# Patient Record
Sex: Female | Born: 1970 | Race: White | Marital: Married | Smoking: Never smoker
Health system: Southern US, Community
[De-identification: ages and names within clinical notes are randomized; demographics above are authoritative.]

## PROBLEM LIST (undated history)

## (undated) DIAGNOSIS — F064 Anxiety disorder due to known physiological condition: Secondary | ICD-10-CM

## (undated) DIAGNOSIS — L03317 Cellulitis of buttock: Secondary | ICD-10-CM

## (undated) DIAGNOSIS — K760 Fatty (change of) liver, not elsewhere classified: Secondary | ICD-10-CM

## (undated) DIAGNOSIS — I4892 Unspecified atrial flutter: Secondary | ICD-10-CM

## (undated) DIAGNOSIS — F32A Depression, unspecified: Secondary | ICD-10-CM

## (undated) DIAGNOSIS — R000 Tachycardia, unspecified: Secondary | ICD-10-CM

## (undated) DIAGNOSIS — L0231 Cutaneous abscess of buttock: Secondary | ICD-10-CM

## (undated) DIAGNOSIS — E876 Hypokalemia: Secondary | ICD-10-CM

## (undated) DIAGNOSIS — B372 Candidiasis of skin and nail: Secondary | ICD-10-CM

## (undated) DIAGNOSIS — E039 Hypothyroidism, unspecified: Secondary | ICD-10-CM

## (undated) DIAGNOSIS — E1165 Type 2 diabetes mellitus with hyperglycemia: Secondary | ICD-10-CM

## (undated) DIAGNOSIS — B373 Candidiasis of vulva and vagina: Secondary | ICD-10-CM

## (undated) DIAGNOSIS — A419 Sepsis, unspecified organism: Secondary | ICD-10-CM

## (undated) DIAGNOSIS — E785 Hyperlipidemia, unspecified: Secondary | ICD-10-CM

## (undated) DIAGNOSIS — Z8709 Personal history of other diseases of the respiratory system: Secondary | ICD-10-CM

## (undated) DIAGNOSIS — I10 Essential (primary) hypertension: Secondary | ICD-10-CM

## (undated) DIAGNOSIS — D649 Anemia, unspecified: Secondary | ICD-10-CM

## (undated) DIAGNOSIS — M62838 Other muscle spasm: Secondary | ICD-10-CM

## (undated) DIAGNOSIS — K589 Irritable bowel syndrome without diarrhea: Secondary | ICD-10-CM

## (undated) DIAGNOSIS — M5441 Lumbago with sciatica, right side: Secondary | ICD-10-CM

## (undated) DIAGNOSIS — E282 Polycystic ovarian syndrome: Secondary | ICD-10-CM

## (undated) DIAGNOSIS — Z6841 Body Mass Index (BMI) 40.0 and over, adult: Secondary | ICD-10-CM

## (undated) DIAGNOSIS — B370 Candidal stomatitis: Secondary | ICD-10-CM

## (undated) DIAGNOSIS — G8929 Other chronic pain: Secondary | ICD-10-CM

## (undated) DIAGNOSIS — N809 Endometriosis, unspecified: Secondary | ICD-10-CM

## (undated) HISTORY — DX: Hypothyroidism, unspecified: E03.9

## (undated) HISTORY — DX: Hyperlipidemia, unspecified: E78.5

## (undated) HISTORY — DX: Essential (primary) hypertension: I10

## (undated) HISTORY — DX: Morbid (severe) obesity due to excess calories: E66.01

## (undated) HISTORY — DX: Personal history of other diseases of the respiratory system: Z87.09

## (undated) HISTORY — DX: Depression, unspecified: F32.A

## (undated) HISTORY — PX: CHOLECYSTECTOMY: SHX55

## (undated) HISTORY — DX: Candidal stomatitis: B37.0

## (undated) HISTORY — PX: ABDOMINAL HYSTERECTOMY: SHX81

## (undated) HISTORY — DX: Sepsis, unspecified organism: A41.9

## (undated) HISTORY — DX: Other muscle spasm: M62.838

## (undated) HISTORY — DX: Type 2 diabetes mellitus with hyperglycemia: E11.65

## (undated) HISTORY — DX: Fatty (change of) liver, not elsewhere classified: K76.0

## (undated) HISTORY — DX: Lumbago with sciatica, right side: M54.41

## (undated) HISTORY — DX: Polycystic ovarian syndrome: E28.2

## (undated) HISTORY — DX: Irritable bowel syndrome, unspecified: K58.9

## (undated) HISTORY — DX: Endometriosis, unspecified: N80.9

## (undated) HISTORY — DX: Body Mass Index (BMI) 40.0 and over, adult: Z684

## (undated) HISTORY — DX: Cutaneous abscess of buttock: L02.31

## (undated) HISTORY — DX: Other chronic pain: G89.29

## (undated) HISTORY — DX: Candidiasis of skin and nail: B37.2

## (undated) HISTORY — DX: Hypokalemia: E87.6

## (undated) HISTORY — DX: Unspecified atrial flutter: I48.92

## (undated) HISTORY — DX: Anxiety disorder due to known physiological condition: F06.4

## (undated) HISTORY — DX: Tachycardia, unspecified: R00.0

## (undated) HISTORY — DX: Cellulitis of buttock: L03.317

## (undated) HISTORY — DX: Candidiasis of vulva and vagina: B37.3

## (undated) HISTORY — DX: Anemia, unspecified: D64.9

---

## 2011-12-11 HISTORY — PX: LAPAROSCOPIC SUPRACERVICAL HYSTERECTOMY: SUR797

## 2016-09-16 DIAGNOSIS — M5441 Lumbago with sciatica, right side: Secondary | ICD-10-CM

## 2016-09-16 DIAGNOSIS — L03317 Cellulitis of buttock: Secondary | ICD-10-CM

## 2016-09-16 DIAGNOSIS — L0231 Cutaneous abscess of buttock: Secondary | ICD-10-CM

## 2016-09-16 DIAGNOSIS — F064 Anxiety disorder due to known physiological condition: Secondary | ICD-10-CM | POA: Insufficient documentation

## 2016-09-16 DIAGNOSIS — E1165 Type 2 diabetes mellitus with hyperglycemia: Secondary | ICD-10-CM

## 2016-09-16 DIAGNOSIS — G8929 Other chronic pain: Secondary | ICD-10-CM

## 2016-09-16 DIAGNOSIS — R000 Tachycardia, unspecified: Secondary | ICD-10-CM

## 2016-09-16 DIAGNOSIS — A419 Sepsis, unspecified organism: Secondary | ICD-10-CM | POA: Insufficient documentation

## 2016-09-16 DIAGNOSIS — Z8709 Personal history of other diseases of the respiratory system: Secondary | ICD-10-CM | POA: Insufficient documentation

## 2016-09-16 DIAGNOSIS — Z6841 Body Mass Index (BMI) 40.0 and over, adult: Secondary | ICD-10-CM

## 2016-09-16 DIAGNOSIS — M62838 Other muscle spasm: Secondary | ICD-10-CM

## 2016-09-16 DIAGNOSIS — K760 Fatty (change of) liver, not elsewhere classified: Secondary | ICD-10-CM

## 2016-09-16 HISTORY — DX: Other muscle spasm: M62.838

## 2016-09-16 HISTORY — DX: Cutaneous abscess of buttock: L02.31

## 2016-09-16 HISTORY — DX: Tachycardia, unspecified: R00.0

## 2016-09-16 HISTORY — DX: Sepsis, unspecified organism: A41.9

## 2016-09-16 HISTORY — DX: Type 2 diabetes mellitus with hyperglycemia: E11.65

## 2016-09-16 HISTORY — DX: Fatty (change of) liver, not elsewhere classified: K76.0

## 2016-09-16 HISTORY — DX: Morbid (severe) obesity due to excess calories: E66.01

## 2016-09-16 HISTORY — DX: Anxiety disorder due to known physiological condition: F06.4

## 2016-09-16 HISTORY — DX: Personal history of other diseases of the respiratory system: Z87.09

## 2016-09-16 HISTORY — DX: Other chronic pain: G89.29

## 2016-09-17 DIAGNOSIS — I4892 Unspecified atrial flutter: Secondary | ICD-10-CM | POA: Insufficient documentation

## 2016-09-17 HISTORY — PX: INCISE AND DRAIN ABCESS: PRO64

## 2016-09-17 HISTORY — DX: Unspecified atrial flutter: I48.92

## 2016-09-22 DIAGNOSIS — B3731 Acute candidiasis of vulva and vagina: Secondary | ICD-10-CM

## 2016-09-22 DIAGNOSIS — B372 Candidiasis of skin and nail: Secondary | ICD-10-CM

## 2016-09-22 DIAGNOSIS — B370 Candidal stomatitis: Secondary | ICD-10-CM

## 2016-09-22 DIAGNOSIS — B373 Candidiasis of vulva and vagina: Secondary | ICD-10-CM

## 2016-09-22 HISTORY — DX: Acute candidiasis of vulva and vagina: B37.31

## 2016-09-22 HISTORY — DX: Candidiasis of skin and nail: B37.2

## 2016-09-22 HISTORY — DX: Candidal stomatitis: B37.0

## 2016-09-22 HISTORY — DX: Candidiasis of vulva and vagina: B37.3

## 2016-09-23 DIAGNOSIS — E876 Hypokalemia: Secondary | ICD-10-CM

## 2016-09-23 HISTORY — DX: Hypokalemia: E87.6

## 2016-10-28 ENCOUNTER — Other Ambulatory Visit: Payer: Self-pay | Admitting: Orthopedic Surgery

## 2016-10-28 DIAGNOSIS — M533 Sacrococcygeal disorders, not elsewhere classified: Secondary | ICD-10-CM

## 2016-11-08 ENCOUNTER — Ambulatory Visit
Admission: RE | Admit: 2016-11-08 | Discharge: 2016-11-08 | Disposition: A | Discharge status: Home / Self Care | Payer: Worker's Compensation | Source: Ambulatory Visit | Attending: Orthopedic Surgery | Admitting: Orthopedic Surgery

## 2016-11-08 DIAGNOSIS — M533 Sacrococcygeal disorders, not elsewhere classified: Secondary | ICD-10-CM

## 2016-11-08 MED ORDER — DIAZEPAM 5 MG PO TABS
10.0000 mg | ORAL_TABLET | Freq: Once | ORAL | Status: AC
  Administered 2016-11-08: 10 mg via ORAL

## 2017-04-10 ENCOUNTER — Other Ambulatory Visit: Payer: Self-pay | Admitting: Cardiology

## 2017-04-10 NOTE — Telephone Encounter (Signed)
Left message to return call to confirm patient will be following Dr. Dulce SellarMunley in [facility].

## 2017-04-11 ENCOUNTER — Other Ambulatory Visit: Payer: Self-pay

## 2017-04-11 MED ORDER — APIXABAN 5 MG PO TABS: 5.0000 mg | ORAL_TABLET | Freq: Two times a day (BID) | ORAL | 0 refills | Status: DC

## 2017-04-11 MED ORDER — DRONEDARONE HCL 400 MG PO TABS: 400.0000 mg | ORAL_TABLET | Freq: Two times a day (BID) | ORAL | 0 refills | Status: DC

## 2017-04-11 MED ORDER — DILTIAZEM HCL ER BEADS 180 MG PO CP24: 180.0000 mg | ORAL_CAPSULE | Freq: Every day | ORAL | 0 refills | Status: DC

## 2017-04-11 NOTE — Telephone Encounter (Signed)
Left message for pt to return call to see if pt is going to continue cardiac care in [place] with Dr Munley 

## 2017-04-18 DIAGNOSIS — N809 Endometriosis, unspecified: Secondary | ICD-10-CM

## 2017-04-18 DIAGNOSIS — I110 Hypertensive heart disease with heart failure: Secondary | ICD-10-CM

## 2017-04-18 DIAGNOSIS — I10 Essential (primary) hypertension: Secondary | ICD-10-CM

## 2017-04-18 DIAGNOSIS — E282 Polycystic ovarian syndrome: Secondary | ICD-10-CM

## 2017-04-18 HISTORY — DX: Polycystic ovarian syndrome: E28.2

## 2017-04-18 HISTORY — DX: Endometriosis, unspecified: N80.9

## 2017-04-18 HISTORY — DX: Hypertensive heart disease with heart failure: I11.0

## 2017-04-18 HISTORY — DX: Essential (primary) hypertension: I10

## 2017-05-20 ENCOUNTER — Encounter: Payer: Self-pay | Admitting: Cardiology

## 2017-05-20 ENCOUNTER — Ambulatory Visit (INDEPENDENT_AMBULATORY_CARE_PROVIDER_SITE_OTHER): Payer: 59 | Admitting: Cardiology

## 2017-05-20 VITALS — BP 164/108 | HR 102 | Ht 66.5 in | Wt 262.1 lb

## 2017-05-20 DIAGNOSIS — I110 Hypertensive heart disease with heart failure: Secondary | ICD-10-CM | POA: Diagnosis not present

## 2017-05-20 DIAGNOSIS — Z7901 Long term (current) use of anticoagulants: Secondary | ICD-10-CM | POA: Insufficient documentation

## 2017-05-20 DIAGNOSIS — Z79899 Other long term (current) drug therapy: Secondary | ICD-10-CM

## 2017-05-20 DIAGNOSIS — I5032 Chronic diastolic (congestive) heart failure: Secondary | ICD-10-CM | POA: Diagnosis not present

## 2017-05-20 DIAGNOSIS — I4892 Unspecified atrial flutter: Secondary | ICD-10-CM | POA: Diagnosis not present

## 2017-05-20 HISTORY — DX: Other long term (current) drug therapy: Z79.899

## 2017-05-20 HISTORY — DX: Chronic diastolic (congestive) heart failure: I50.32

## 2017-05-20 HISTORY — DX: Long term (current) use of anticoagulants: Z79.01

## 2017-05-20 MED ORDER — FUROSEMIDE 20 MG PO TABS: 20.0000 mg | ORAL_TABLET | ORAL | 3 refills | Status: DC

## 2017-05-20 MED ORDER — FUROSEMIDE 20 MG PO TABS: 20.0000 mg | ORAL_TABLET | Freq: Every day | ORAL | 3 refills | Status: DC

## 2017-05-20 NOTE — Patient Instructions (Addendum)
Medication Instructions:  Your physician has recommended you make the following change in your medication:  CHANGE furosemide (Lasix) 20 mg on Monday, Wednesday, Friday  Labwork: Your physician recommends that you return for lab work in: 1 week at your primary doctor. BMP  Testing/Procedures: None  Follow-Up: Your physician wants you to follow-up in: 6 months. You will receive a reminder letter in the mail two months in advance. If you don't receive a letter, please call our office to schedule the follow-up appointment.  Any Other Special Instructions Will Be Listed Below (If Applicable).     If you need a refill on your cardiac medications before your next appointment, please call your pharmacy.    KardiaMobile [email or web address]        FDA-cleared, clinical grade mobile EKG monitor: Lourena Simmonds is the most clinically-validated mobile EKG used by the world's leading cardiac care medical professionals With Basic service, know instantly if your heart rhythm is normal or if atrial fibrillation is detected, and email the last single EKG recording to yourself or your doctor Premium service, available for purchase through the Kardia app for $9.99 per month or $99 per year, includes unlimited history and storage of your EKG recordings, a monthly EKG summary report to share with your doctor, along with the ability to track your blood pressure, activity and weight Includes one KardiaMobile phone clip FREE SHIPPING: Standard delivery 1-3 business days. Orders placed by 11:00am PST will ship that afternoon. Otherwise, will ship next business day. All orders ship via [employer] from [place], [place]
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
## 2017-05-21 NOTE — Addendum Note (Signed)
Addended by: Lamona CurlOUTH, NICOLE H on: 05/21/2017 08:31 AM   Modules accepted: Orders

## 2017-07-18 ENCOUNTER — Other Ambulatory Visit: Payer: Self-pay | Admitting: Cardiology

## 2017-08-04 ENCOUNTER — Other Ambulatory Visit: Payer: Self-pay | Admitting: Cardiology

## 2017-08-20 ENCOUNTER — Other Ambulatory Visit: Payer: Self-pay | Admitting: Cardiology
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
## 2017-12-03 ENCOUNTER — Ambulatory Visit (INDEPENDENT_AMBULATORY_CARE_PROVIDER_SITE_OTHER): Payer: 59 | Admitting: Cardiology

## 2017-12-03 ENCOUNTER — Encounter: Payer: Self-pay | Admitting: Cardiology

## 2017-12-03 VITALS — BP 136/94 | HR 112 | Ht 66.5 in | Wt 260.1 lb

## 2017-12-03 DIAGNOSIS — I110 Hypertensive heart disease with heart failure: Secondary | ICD-10-CM

## 2017-12-03 DIAGNOSIS — Z7901 Long term (current) use of anticoagulants: Secondary | ICD-10-CM

## 2017-12-03 DIAGNOSIS — I4892 Unspecified atrial flutter: Secondary | ICD-10-CM

## 2017-12-03 DIAGNOSIS — Z79899 Other long term (current) drug therapy: Secondary | ICD-10-CM | POA: Diagnosis not present

## 2017-12-03 DIAGNOSIS — E1165 Type 2 diabetes mellitus with hyperglycemia: Secondary | ICD-10-CM | POA: Diagnosis not present

## 2017-12-03 DIAGNOSIS — I5032 Chronic diastolic (congestive) heart failure: Secondary | ICD-10-CM

## 2017-12-03 NOTE — Patient Instructions (Addendum)
Medication Instructions:  Your physician recommends that you continue on your current medications as directed. Please refer to the Current Medication list given to you today.  Labwork: None  Testing/Procedures: You had an EKG today.  Follow-Up: Your physician wants you to follow-up in: 6 months. You will receive a reminder letter in the mail two months in advance. If you don't receive a letter, please call our office to schedule the follow-up appointment.  Any Other Special Instructions Will Be Listed Below (If Applicable).     If you need a refill on your cardiac medications before your next appointment, please call your pharmacy.    1. Avoid all over-the-counter antihistamines except Claritin/Loratadine and Zyrtec/Cetrizine. 2. Avoid all combination including cold sinus allergies flu decongestant and sleep medications 3. You can use Robitussin DM Mucinex and Mucinex DM for cough. 4. can use Tylenol aspirin ibuprofen and naproxen but no combinations such as sleep or sinus. 

## 2018-02-20 ENCOUNTER — Other Ambulatory Visit: Payer: Self-pay | Admitting: Cardiology

## 2018-02-25 ENCOUNTER — Other Ambulatory Visit: Payer: Self-pay | Admitting: Cardiology

## 2018-04-24 ENCOUNTER — Encounter: Payer: Self-pay | Admitting: Cardiology

## 2018-04-24 ENCOUNTER — Ambulatory Visit (INDEPENDENT_AMBULATORY_CARE_PROVIDER_SITE_OTHER): Payer: 59 | Admitting: Cardiology

## 2018-04-24 VITALS — BP 158/76 | HR 104 | Wt 266.4 lb

## 2018-04-24 DIAGNOSIS — Z7901 Long term (current) use of anticoagulants: Secondary | ICD-10-CM | POA: Diagnosis not present

## 2018-04-24 DIAGNOSIS — I4892 Unspecified atrial flutter: Secondary | ICD-10-CM

## 2018-04-24 DIAGNOSIS — I110 Hypertensive heart disease with heart failure: Secondary | ICD-10-CM

## 2018-04-24 DIAGNOSIS — I5032 Chronic diastolic (congestive) heart failure: Secondary | ICD-10-CM

## 2018-04-24 MED ORDER — DILTIAZEM HCL ER COATED BEADS 180 MG PO CP24: 180.0000 mg | ORAL_CAPSULE | Freq: Every day | ORAL | 11 refills | Status: DC

## 2018-04-24 NOTE — Patient Instructions (Addendum)
Medication Instructions:  Your physician recommends that you continue on your current medications as directed. Please refer to the Current Medication list given to you today.   Labwork: None  Testing/Procedures: You had an EKG today.   Follow-Up: Your physician wants you to follow-up in: 1 year. You will receive a reminder letter in the mail two months in advance. If you don't receive a letter, please call our office to schedule the follow-up appointment.   If you need a refill on your cardiac medications before your next appointment, please call your pharmacy.   Thank you for choosing [facility]! Catherine Lockhart, RN [phone]      1. Avoid all over-the-counter antihistamines except Claritin/Loratadine and Zyrtec/Cetrizine. 2. Avoid all combination including cold sinus allergies flu decongestant and sleep medications 3. You can use Robitussin DM Mucinex and Mucinex DM for cough. 4. can use Tylenol aspirin ibuprofen and naproxen but no combinations such as sleep or sinus. 
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
## 2018-06-08 DIAGNOSIS — I10 Essential (primary) hypertension: Secondary | ICD-10-CM | POA: Diagnosis not present

## 2018-06-08 DIAGNOSIS — E039 Hypothyroidism, unspecified: Secondary | ICD-10-CM | POA: Diagnosis not present

## 2018-06-08 DIAGNOSIS — E1165 Type 2 diabetes mellitus with hyperglycemia: Secondary | ICD-10-CM | POA: Diagnosis not present

## 2018-06-08 DIAGNOSIS — E785 Hyperlipidemia, unspecified: Secondary | ICD-10-CM | POA: Diagnosis not present

## 2018-07-13 ENCOUNTER — Other Ambulatory Visit: Payer: Self-pay | Admitting: Cardiology

## 2018-07-13 DIAGNOSIS — I4892 Unspecified atrial flutter: Secondary | ICD-10-CM

## 2018-07-13 DIAGNOSIS — I5032 Chronic diastolic (congestive) heart failure: Secondary | ICD-10-CM

## 2018-07-16 ENCOUNTER — Other Ambulatory Visit: Payer: Self-pay | Admitting: Cardiology

## 2018-07-28 ENCOUNTER — Other Ambulatory Visit: Payer: Self-pay | Admitting: Cardiology

## 2018-08-08 ENCOUNTER — Other Ambulatory Visit: Payer: Self-pay | Admitting: Cardiology

## 2018-10-08 DIAGNOSIS — Z6841 Body Mass Index (BMI) 40.0 and over, adult: Secondary | ICD-10-CM | POA: Diagnosis not present

## 2018-10-08 DIAGNOSIS — K529 Noninfective gastroenteritis and colitis, unspecified: Secondary | ICD-10-CM | POA: Diagnosis not present

## 2018-10-12 DIAGNOSIS — E1165 Type 2 diabetes mellitus with hyperglycemia: Secondary | ICD-10-CM | POA: Diagnosis not present

## 2018-10-12 DIAGNOSIS — I4892 Unspecified atrial flutter: Secondary | ICD-10-CM | POA: Diagnosis not present

## 2018-10-12 DIAGNOSIS — E785 Hyperlipidemia, unspecified: Secondary | ICD-10-CM | POA: Diagnosis not present

## 2018-10-12 DIAGNOSIS — I10 Essential (primary) hypertension: Secondary | ICD-10-CM | POA: Diagnosis not present

## 2018-10-12 DIAGNOSIS — Z6841 Body Mass Index (BMI) 40.0 and over, adult: Secondary | ICD-10-CM | POA: Diagnosis not present

## 2018-10-12 DIAGNOSIS — E039 Hypothyroidism, unspecified: Secondary | ICD-10-CM | POA: Diagnosis not present

## 2018-10-14 ENCOUNTER — Telehealth: Payer: Self-pay

## 2018-10-14 NOTE — Telephone Encounter (Signed)
Patient called the office and stated that she recently had lab work done and her Hgb is in the 10. She was advised that she should start a multivitamin with iron and wonder if this was okay to this with her Multaq. I reviewed for any interactions and advised the patient that I did not see any and that she should be fine. She was also wondering about taking Vistaril. I also looked for interactions on this and advised her I did not see any, but also let her know if she did have any issues that she discontinue the Vistaril.

## 2018-12-01 DIAGNOSIS — R05 Cough: Secondary | ICD-10-CM | POA: Diagnosis not present

## 2018-12-01 DIAGNOSIS — R6889 Other general symptoms and signs: Secondary | ICD-10-CM | POA: Diagnosis not present

## 2019-02-08 IMAGING — CT CT BIOPSY
2 of 9 series · 7 of 32 positions shown, 12 images · non-contrast
Comparison: none

CLINICAL DATA: Sacroiliac joint dysfunction.  Right-sided pain.

[Series 3: needle -guided injection · axial · 0.74mm/px · z∈[+794,+834]mm · 5 of 31 slices shown, 10 images (1 of 2)]
[im 6/31  soft-tissue]
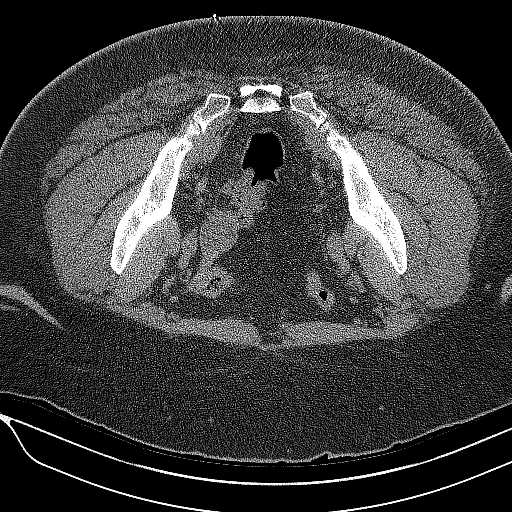
[im 6/31  bone]
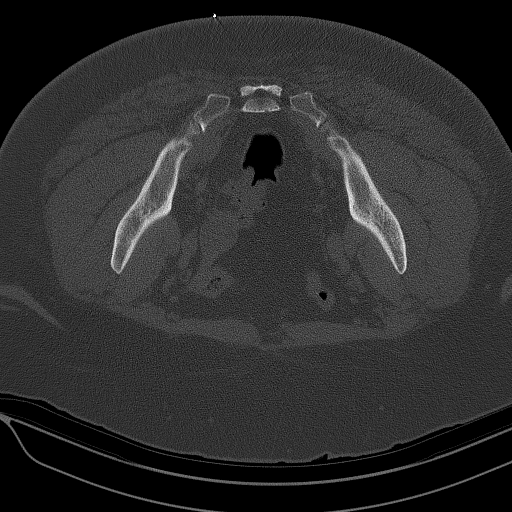
[im 11/31  soft-tissue]
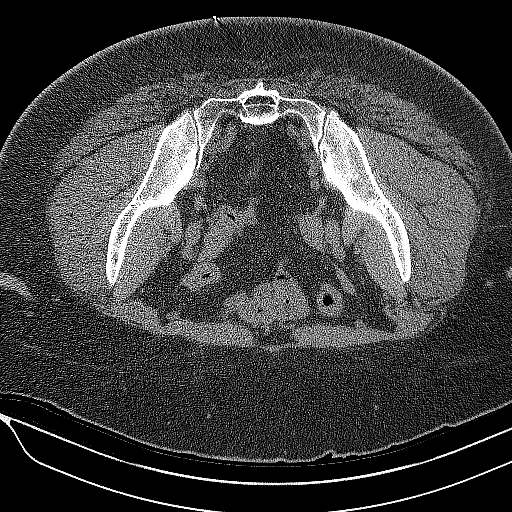
[im 11/31  lung]
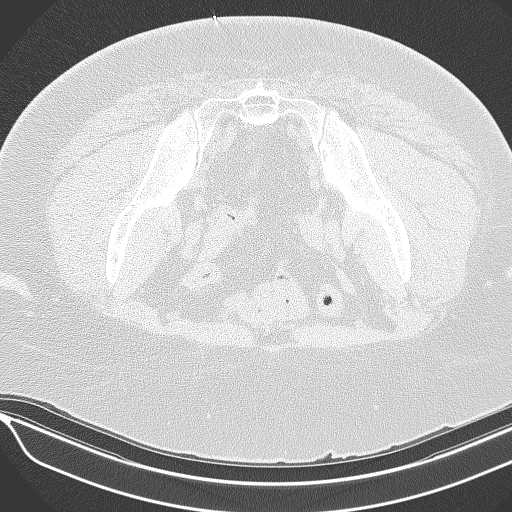
[im 16/31  soft-tissue]
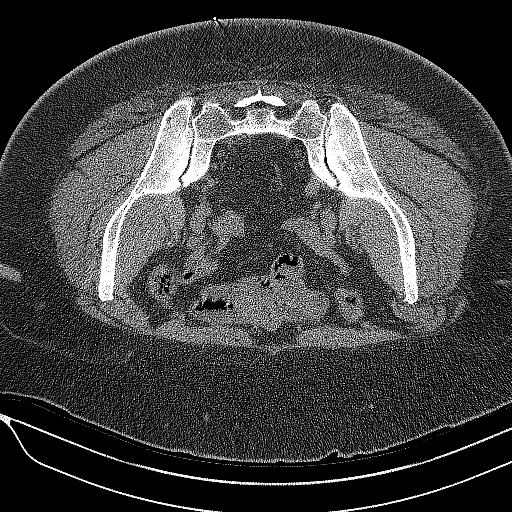
[im 16/31  lung]
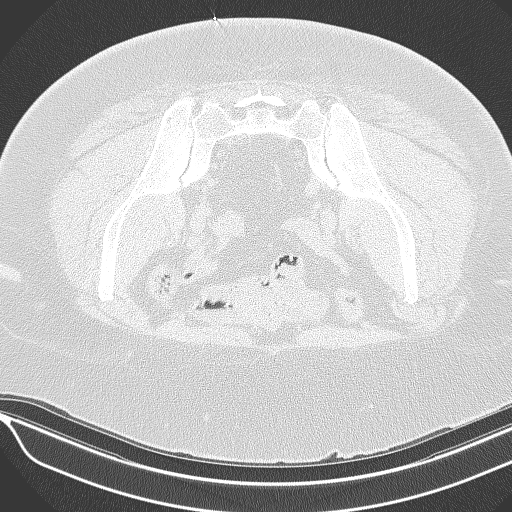
[im 21/31  soft-tissue]
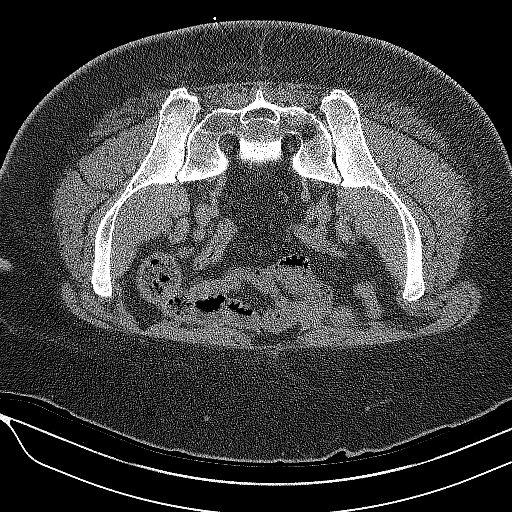
[im 21/31  lung]
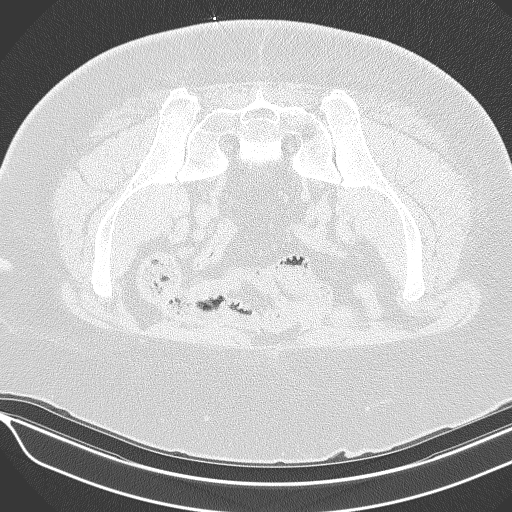
[im 26/31  soft-tissue]
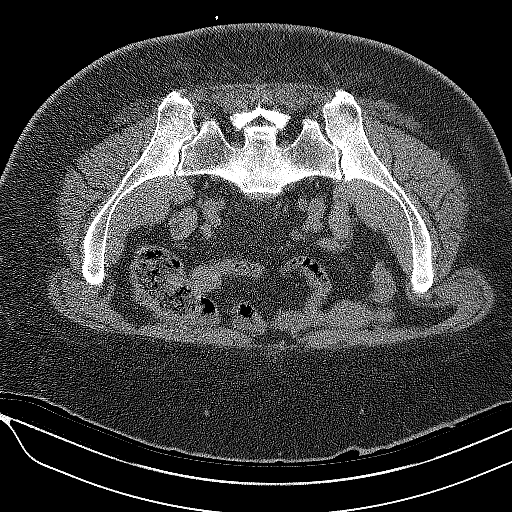
[im 26/31  lung]
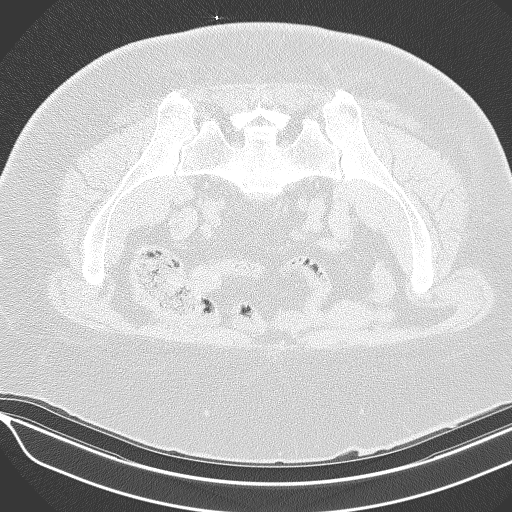

[Series 4: needle -guided injection · axial · 0.74mm/px · z∈[+801,+811]mm · 2 of 17 slices shown (2 of 2)]
[im 6/17  soft-tissue]
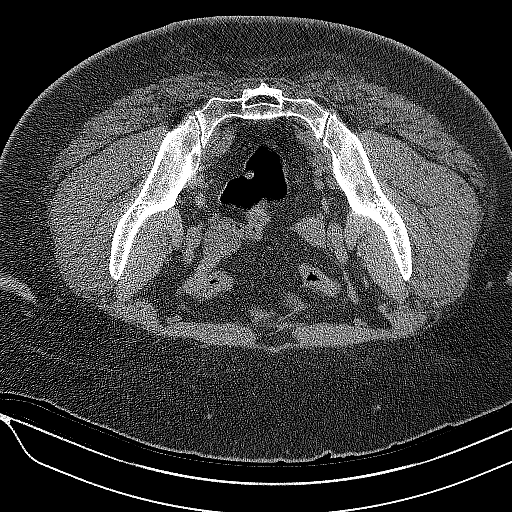
[im 11/17  soft-tissue]
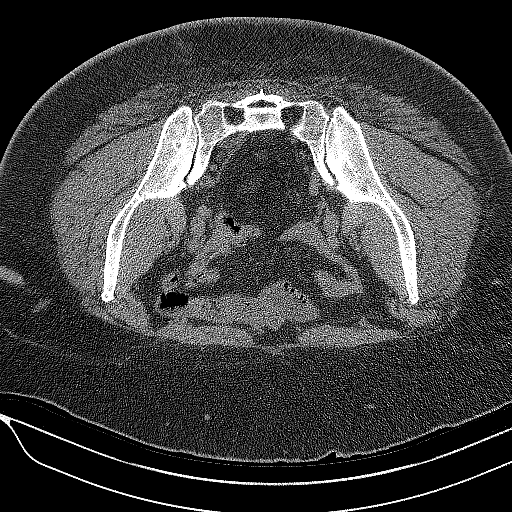

[7 of 32 positions shown; findings below may reference images not displayed]

EXAM:
CT GUIDED RIGHT SI JOINT INJECTION



After local anesthesia with 1% lidocaine without epinephrine and
subsequent deep anesthesia, a 22 gauge spinal needle was advanced
into the right SI joint under intermittent CT guidance.

Once the needle was in satisfactory position, representative image
was captured with the needle demonstrated in the sacroiliac joint.
Injection of 0.5 mL of Isovue M 200 also confirmed intra-articular
needle placement. Subsequently, 1.5 mL of 0.5% bupivacaine were
injected into the right SI joint. The needle was removed and a
sterile dressing applied.

No complications were observed.
IMPRESSION: Successful CT-guided right SI joint injection.

## 2019-02-25 DIAGNOSIS — Z6841 Body Mass Index (BMI) 40.0 and over, adult: Secondary | ICD-10-CM | POA: Diagnosis not present

## 2019-02-25 DIAGNOSIS — E785 Hyperlipidemia, unspecified: Secondary | ICD-10-CM | POA: Diagnosis not present

## 2019-02-25 DIAGNOSIS — I10 Essential (primary) hypertension: Secondary | ICD-10-CM | POA: Diagnosis not present

## 2019-02-25 DIAGNOSIS — E1165 Type 2 diabetes mellitus with hyperglycemia: Secondary | ICD-10-CM | POA: Diagnosis not present

## 2019-03-17 ENCOUNTER — Other Ambulatory Visit: Payer: Self-pay | Admitting: Cardiology

## 2019-04-05 DIAGNOSIS — R6889 Other general symptoms and signs: Secondary | ICD-10-CM | POA: Diagnosis not present

## 2019-05-04 ENCOUNTER — Other Ambulatory Visit: Payer: Self-pay | Admitting: Cardiology

## 2019-05-05 ENCOUNTER — Other Ambulatory Visit: Payer: Self-pay | Admitting: Cardiology

## 2019-06-15 DIAGNOSIS — R6889 Other general symptoms and signs: Secondary | ICD-10-CM | POA: Diagnosis not present

## 2019-06-15 DIAGNOSIS — Z20828 Contact with and (suspected) exposure to other viral communicable diseases: Secondary | ICD-10-CM | POA: Diagnosis not present

## 2019-06-21 DIAGNOSIS — R05 Cough: Secondary | ICD-10-CM | POA: Diagnosis not present

## 2019-06-21 DIAGNOSIS — R091 Pleurisy: Secondary | ICD-10-CM | POA: Diagnosis not present

## 2019-06-21 DIAGNOSIS — R0602 Shortness of breath: Secondary | ICD-10-CM | POA: Diagnosis not present

## 2019-06-28 DIAGNOSIS — E1165 Type 2 diabetes mellitus with hyperglycemia: Secondary | ICD-10-CM | POA: Diagnosis not present

## 2019-06-28 DIAGNOSIS — R091 Pleurisy: Secondary | ICD-10-CM | POA: Diagnosis not present

## 2019-06-28 DIAGNOSIS — E785 Hyperlipidemia, unspecified: Secondary | ICD-10-CM | POA: Diagnosis not present

## 2019-06-28 DIAGNOSIS — I10 Essential (primary) hypertension: Secondary | ICD-10-CM | POA: Diagnosis not present

## 2019-07-27 ENCOUNTER — Other Ambulatory Visit: Payer: Self-pay | Admitting: Cardiology

## 2019-07-27 DIAGNOSIS — I5032 Chronic diastolic (congestive) heart failure: Secondary | ICD-10-CM

## 2019-07-27 DIAGNOSIS — I4892 Unspecified atrial flutter: Secondary | ICD-10-CM

## 2019-09-07 ENCOUNTER — Other Ambulatory Visit: Payer: Self-pay | Admitting: Cardiology

## 2019-09-07 NOTE — Telephone Encounter (Signed)
Rx refill sent to pharmacy. 

## 2019-09-17 ENCOUNTER — Other Ambulatory Visit: Payer: Self-pay | Admitting: Cardiology

## 2019-10-08 ENCOUNTER — Other Ambulatory Visit: Payer: Self-pay | Admitting: Cardiology

## 2019-10-11 ENCOUNTER — Other Ambulatory Visit: Payer: Self-pay | Admitting: Cardiology

## 2019-10-29 ENCOUNTER — Other Ambulatory Visit: Payer: Self-pay

## 2019-10-29 ENCOUNTER — Encounter: Payer: Self-pay | Admitting: Cardiology

## 2019-10-29 ENCOUNTER — Ambulatory Visit (INDEPENDENT_AMBULATORY_CARE_PROVIDER_SITE_OTHER): Payer: BC Managed Care – PPO

## 2019-10-29 ENCOUNTER — Ambulatory Visit (INDEPENDENT_AMBULATORY_CARE_PROVIDER_SITE_OTHER): Payer: BC Managed Care – PPO | Admitting: Cardiology

## 2019-10-29 VITALS — BP 166/80 | HR 105 | Ht 66.0 in | Wt 270.0 lb

## 2019-10-29 DIAGNOSIS — Z7901 Long term (current) use of anticoagulants: Secondary | ICD-10-CM | POA: Diagnosis not present

## 2019-10-29 DIAGNOSIS — R002 Palpitations: Secondary | ICD-10-CM

## 2019-10-29 DIAGNOSIS — I110 Hypertensive heart disease with heart failure: Secondary | ICD-10-CM | POA: Diagnosis not present

## 2019-10-29 DIAGNOSIS — Z6841 Body Mass Index (BMI) 40.0 and over, adult: Secondary | ICD-10-CM | POA: Diagnosis not present

## 2019-10-29 DIAGNOSIS — I4892 Unspecified atrial flutter: Secondary | ICD-10-CM | POA: Diagnosis not present

## 2019-10-29 DIAGNOSIS — E1165 Type 2 diabetes mellitus with hyperglycemia: Secondary | ICD-10-CM | POA: Diagnosis not present

## 2019-10-29 DIAGNOSIS — E785 Hyperlipidemia, unspecified: Secondary | ICD-10-CM | POA: Diagnosis not present

## 2019-10-29 DIAGNOSIS — Z79899 Other long term (current) drug therapy: Secondary | ICD-10-CM

## 2019-10-29 DIAGNOSIS — E039 Hypothyroidism, unspecified: Secondary | ICD-10-CM | POA: Diagnosis not present

## 2019-10-29 DIAGNOSIS — I10 Essential (primary) hypertension: Secondary | ICD-10-CM | POA: Diagnosis not present

## 2019-10-29 MED ORDER — LISINOPRIL 5 MG PO TABS: 5.0000 mg | ORAL_TABLET | Freq: Every day | ORAL | 0 refills | Status: DC
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
## 2019-10-29 NOTE — Patient Instructions (Signed)
Medication Instructions:  Your physician recommends that you continue on your current medications as directed. Please refer to the Current Medication list given to you today.  *If you need a refill on your cardiac medications before your next appointment, please call your pharmacy*   Lab Work: None If you have labs (blood work) drawn today and your tests are completely normal, you will receive your results only by: . MyChart Message (if you have MyChart) OR . A paper copy in the mail If you have any lab test that is abnormal or we need to change your treatment, we will call you to review the results.   Testing/Procedures: A zio monitor was ordered today. It will remain on for 14 days. You will then return monitor and event diary in provided box. It takes 1-2 weeks for report to be downloaded and returned to us. We will call you with the results. If monitor falls off or has orange flashing light, please call Zio for further instructions.      Follow-Up: At [facility], you and your health needs are our priority.  As part of our continuing mission to provide you with exceptional heart care, we have created designated Provider Care Teams.  These Care Teams include your primary Cardiologist (physician) and Advanced Practice Providers (APPs -  Physician Assistants and Nurse Practitioners) who all work together to provide you with the care you need, when you need it.  We recommend signing up for the patient portal called "MyChart".  Sign up information is provided on this After Visit Summary.  MyChart is used to connect with patients for Virtual Visits (Telemedicine).  Patients are able to view lab/test results, encounter notes, upcoming appointments, etc.  Non-urgent messages can be sent to your provider as well.   To learn more about what you can do with MyChart, go to [email or web address].    Your next appointment:   6 week(s)  The format for your next appointment:   In  Person  Provider:   Brian Munley, MD   Other Instructions   

## 2019-11-08 ENCOUNTER — Other Ambulatory Visit: Payer: Self-pay | Admitting: Cardiology

## 2019-11-09 ENCOUNTER — Telehealth: Payer: Self-pay | Admitting: Cardiology

## 2019-11-09 DIAGNOSIS — R002 Palpitations: Secondary | ICD-10-CM | POA: Diagnosis not present

## 2019-11-09 MED ORDER — APIXABAN 5 MG PO TABS: 5.0000 mg | ORAL_TABLET | Freq: Two times a day (BID) | ORAL | 3 refills | Status: DC

## 2019-11-09 NOTE — Telephone Encounter (Signed)
rx sent to pharmacy

## 2019-11-09 NOTE — Telephone Encounter (Signed)
New message   Patient needs a new prescription for apixaban (ELIQUIS) 5 MG TABS tablet please send to [facility] - [place], [place] - [address]

## 2019-11-12 ENCOUNTER — Other Ambulatory Visit: Payer: Self-pay | Admitting: Cardiology

## 2019-11-15 ENCOUNTER — Other Ambulatory Visit: Payer: Self-pay | Admitting: Cardiology

## 2019-11-15 ENCOUNTER — Telehealth: Payer: Self-pay

## 2019-11-15 NOTE — Telephone Encounter (Signed)
Patient returned call for results 

## 2019-11-15 NOTE — Telephone Encounter (Signed)
The patient has been notified of the result and verbalized understanding.  All questions (if any) were answered. Leanord Hawking, RN 11/15/2019 4:52 PM

## 2019-11-15 NOTE — Telephone Encounter (Signed)
-----   Message from Baldo Daub, MD sent at 11/15/2019 11:28 AM EDT ----- Normal or stable result \ Good result no episodes of atrial flutter no change in treatment.

## 2019-11-15 NOTE — Telephone Encounter (Signed)
LMTCB regarding monitor results

## 2019-11-15 NOTE — Telephone Encounter (Signed)
-----   Message from Brian J Munley, MD sent at 11/15/2019 11:28 AM EDT ----- Normal or stable result \ Good result no episodes of atrial flutter no change in treatment. 

## 2019-11-16 DIAGNOSIS — R6889 Other general symptoms and signs: Secondary | ICD-10-CM | POA: Diagnosis not present

## 2019-11-22 DIAGNOSIS — R6889 Other general symptoms and signs: Secondary | ICD-10-CM | POA: Diagnosis not present

## 2019-11-22 DIAGNOSIS — D649 Anemia, unspecified: Secondary | ICD-10-CM | POA: Diagnosis not present

## 2019-11-22 DIAGNOSIS — D539 Nutritional anemia, unspecified: Secondary | ICD-10-CM | POA: Diagnosis not present

## 2019-12-08 ENCOUNTER — Other Ambulatory Visit: Payer: Self-pay | Admitting: Cardiology

## 2019-12-09 ENCOUNTER — Other Ambulatory Visit: Payer: Self-pay
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
## 2019-12-10 ENCOUNTER — Encounter: Payer: Self-pay | Admitting: Cardiology

## 2019-12-10 ENCOUNTER — Other Ambulatory Visit: Payer: Self-pay

## 2019-12-10 ENCOUNTER — Ambulatory Visit (INDEPENDENT_AMBULATORY_CARE_PROVIDER_SITE_OTHER): Payer: BC Managed Care – PPO | Admitting: Cardiology

## 2019-12-10 VITALS — BP 162/78 | HR 90 | Temp 97.3°F | Ht 66.0 in | Wt 272.2 lb

## 2019-12-10 DIAGNOSIS — I110 Hypertensive heart disease with heart failure: Secondary | ICD-10-CM | POA: Diagnosis not present

## 2019-12-10 DIAGNOSIS — Z7901 Long term (current) use of anticoagulants: Secondary | ICD-10-CM

## 2019-12-10 DIAGNOSIS — Z79899 Other long term (current) drug therapy: Secondary | ICD-10-CM

## 2019-12-10 DIAGNOSIS — I4892 Unspecified atrial flutter: Secondary | ICD-10-CM

## 2019-12-10 DIAGNOSIS — I5032 Chronic diastolic (congestive) heart failure: Secondary | ICD-10-CM

## 2019-12-10 MED ORDER — APIXABAN 5 MG PO TABS: 5.0000 mg | ORAL_TABLET | Freq: Two times a day (BID) | ORAL | 3 refills | Status: DC

## 2019-12-10 MED ORDER — DILTIAZEM HCL ER COATED BEADS 180 MG PO CP24: 180.0000 mg | ORAL_CAPSULE | Freq: Every day | ORAL | 3 refills | Status: DC

## 2019-12-10 MED ORDER — FUROSEMIDE 20 MG PO TABS: 20.0000 mg | ORAL_TABLET | ORAL | 3 refills | Status: DC

## 2019-12-10 MED ORDER — LISINOPRIL 5 MG PO TABS: 5.0000 mg | ORAL_TABLET | Freq: Every day | ORAL | 3 refills | Status: DC

## 2019-12-10 NOTE — Addendum Note (Signed)
Addended by: Delorse Limber I on: 12/10/2019 01:29 PM   Modules accepted: Orders

## 2019-12-10 NOTE — Patient Instructions (Signed)

## 2020-02-29 ENCOUNTER — Other Ambulatory Visit: Payer: Self-pay | Admitting: Cardiology

## 2020-02-29 MED ORDER — MULTAQ 400 MG PO TABS: 400.0000 mg | ORAL_TABLET | Freq: Two times a day (BID) | ORAL | 0 refills | Status: DC

## 2020-02-29 NOTE — Telephone Encounter (Signed)
New Message   *STAT* If patient is at the pharmacy, call can be transferred to refill team.   1. Which medications need to be refilled? (please list name of each medication and dose if known) MULTAQ 400 MG tablet(Expired)  2. Which pharmacy/location (including street and city if local pharmacy) is medication to be sent to? [facility] - [place], [place] - [address]  3. Do they need a 30 day or 90 day supply? 90 day    Patient is out of medication and needs prescription today.

## 2020-02-29 NOTE — Telephone Encounter (Signed)
Refill sent in per request.  

## 2020-03-20 DIAGNOSIS — D649 Anemia, unspecified: Secondary | ICD-10-CM | POA: Diagnosis not present

## 2020-03-20 DIAGNOSIS — I4892 Unspecified atrial flutter: Secondary | ICD-10-CM | POA: Diagnosis not present

## 2020-03-20 DIAGNOSIS — I10 Essential (primary) hypertension: Secondary | ICD-10-CM | POA: Diagnosis not present

## 2020-03-20 DIAGNOSIS — E785 Hyperlipidemia, unspecified: Secondary | ICD-10-CM | POA: Diagnosis not present

## 2020-05-17 ENCOUNTER — Other Ambulatory Visit: Payer: Self-pay | Admitting: Cardiology

## 2020-05-17 NOTE — Telephone Encounter (Signed)
Refill sent to pharmacy.   

## 2020-05-22 DIAGNOSIS — J180 Bronchopneumonia, unspecified organism: Secondary | ICD-10-CM | POA: Diagnosis not present

## 2020-05-25 DIAGNOSIS — Z6841 Body Mass Index (BMI) 40.0 and over, adult: Secondary | ICD-10-CM | POA: Diagnosis not present

## 2020-05-25 DIAGNOSIS — J180 Bronchopneumonia, unspecified organism: Secondary | ICD-10-CM | POA: Diagnosis not present

## 2020-05-25 DIAGNOSIS — Z1331 Encounter for screening for depression: Secondary | ICD-10-CM | POA: Diagnosis not present

## 2020-06-08 ENCOUNTER — Other Ambulatory Visit: Payer: Self-pay | Admitting: Cardiology

## 2020-07-21 DIAGNOSIS — I4892 Unspecified atrial flutter: Secondary | ICD-10-CM | POA: Diagnosis not present

## 2020-07-21 DIAGNOSIS — D649 Anemia, unspecified: Secondary | ICD-10-CM | POA: Diagnosis not present

## 2020-07-21 DIAGNOSIS — E785 Hyperlipidemia, unspecified: Secondary | ICD-10-CM | POA: Diagnosis not present

## 2020-07-21 DIAGNOSIS — I10 Essential (primary) hypertension: Secondary | ICD-10-CM | POA: Diagnosis not present

## 2020-07-31 DIAGNOSIS — J019 Acute sinusitis, unspecified: Secondary | ICD-10-CM | POA: Diagnosis not present

## 2020-07-31 DIAGNOSIS — B9689 Other specified bacterial agents as the cause of diseases classified elsewhere: Secondary | ICD-10-CM | POA: Diagnosis not present

## 2020-09-25 ENCOUNTER — Other Ambulatory Visit: Payer: Self-pay | Admitting: Cardiology

## 2020-09-27 ENCOUNTER — Telehealth: Payer: Self-pay | Admitting: Cardiology

## 2020-09-27 MED ORDER — DILTIAZEM HCL ER COATED BEADS 180 MG PO CP24
180.0000 mg | ORAL_CAPSULE | Freq: Every day | ORAL | 3 refills | Status: DC
Start: 2020-09-27 — End: 2021-02-06

## 2020-09-27 NOTE — Telephone Encounter (Signed)
Refill sent in per request.  

## 2020-09-27 NOTE — Telephone Encounter (Signed)
*  STAT* If patient is at the pharmacy, call can be transferred to refill team.   1. Which medications need to be refilled? (please list name of each medication and dose if known) Diltiazem  2. Which pharmacy/location (including street and city if local pharmacy) is medication to be sent to? [facility], [place]  3. Do they need a 30 day or 90 day supply? 30 days

## 2020-12-18 ENCOUNTER — Other Ambulatory Visit: Payer: Self-pay | Admitting: Cardiology

## 2020-12-18 NOTE — Telephone Encounter (Signed)
27f, 123.5kg, Creatinine, Serum 0.650 mg/ 07/21/2020, lovw/munley 12/10/19

## 2020-12-25 DIAGNOSIS — E785 Hyperlipidemia, unspecified: Secondary | ICD-10-CM | POA: Diagnosis not present

## 2020-12-25 DIAGNOSIS — D649 Anemia, unspecified: Secondary | ICD-10-CM | POA: Diagnosis not present

## 2020-12-25 DIAGNOSIS — I10 Essential (primary) hypertension: Secondary | ICD-10-CM | POA: Diagnosis not present

## 2020-12-25 DIAGNOSIS — I4892 Unspecified atrial flutter: Secondary | ICD-10-CM | POA: Diagnosis not present

## 2021-01-09 ENCOUNTER — Other Ambulatory Visit: Payer: Self-pay | Admitting: Cardiology

## 2021-01-27 ENCOUNTER — Other Ambulatory Visit: Payer: Self-pay | Admitting: Cardiology

## 2021-01-29 NOTE — Telephone Encounter (Signed)
Rx approved and sent 
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
## 2021-02-06 ENCOUNTER — Ambulatory Visit (INDEPENDENT_AMBULATORY_CARE_PROVIDER_SITE_OTHER): Payer: BC Managed Care – PPO | Admitting: Cardiology

## 2021-02-06 ENCOUNTER — Other Ambulatory Visit: Payer: Self-pay

## 2021-02-06 ENCOUNTER — Encounter: Payer: Self-pay | Admitting: Cardiology

## 2021-02-06 VITALS — BP 140/64 | HR 106 | Ht 66.0 in | Wt 274.0 lb

## 2021-02-06 DIAGNOSIS — I4892 Unspecified atrial flutter: Secondary | ICD-10-CM

## 2021-02-06 DIAGNOSIS — Z7901 Long term (current) use of anticoagulants: Secondary | ICD-10-CM

## 2021-02-06 DIAGNOSIS — Z79899 Other long term (current) drug therapy: Secondary | ICD-10-CM

## 2021-02-06 DIAGNOSIS — I110 Hypertensive heart disease with heart failure: Secondary | ICD-10-CM | POA: Diagnosis not present

## 2021-02-06 NOTE — Patient Instructions (Signed)

## 2021-02-13 DIAGNOSIS — W57XXXA Bitten or stung by nonvenomous insect and other nonvenomous arthropods, initial encounter: Secondary | ICD-10-CM | POA: Diagnosis not present

## 2021-02-13 DIAGNOSIS — L089 Local infection of the skin and subcutaneous tissue, unspecified: Secondary | ICD-10-CM | POA: Diagnosis not present

## 2021-02-13 DIAGNOSIS — S50861A Insect bite (nonvenomous) of right forearm, initial encounter: Secondary | ICD-10-CM | POA: Diagnosis not present

## 2021-02-13 DIAGNOSIS — R110 Nausea: Secondary | ICD-10-CM | POA: Diagnosis not present

## 2021-02-23 ENCOUNTER — Other Ambulatory Visit: Payer: Self-pay | Admitting: Cardiology

## 2021-02-23 NOTE — Telephone Encounter (Signed)
Lisinopril 5 mg# 90 x 3 refills sent to [facility] - [place], [place] - [address]

## 2021-02-28 ENCOUNTER — Other Ambulatory Visit: Payer: Self-pay | Admitting: Cardiology

## 2021-04-11 DIAGNOSIS — R002 Palpitations: Secondary | ICD-10-CM | POA: Diagnosis not present

## 2021-04-11 DIAGNOSIS — R197 Diarrhea, unspecified: Secondary | ICD-10-CM | POA: Diagnosis not present

## 2021-04-11 DIAGNOSIS — K589 Irritable bowel syndrome without diarrhea: Secondary | ICD-10-CM | POA: Diagnosis not present

## 2021-04-11 DIAGNOSIS — I499 Cardiac arrhythmia, unspecified: Secondary | ICD-10-CM | POA: Diagnosis not present

## 2021-04-11 DIAGNOSIS — I110 Hypertensive heart disease with heart failure: Secondary | ICD-10-CM | POA: Diagnosis not present

## 2021-04-11 DIAGNOSIS — R55 Syncope and collapse: Secondary | ICD-10-CM | POA: Diagnosis not present

## 2021-04-11 DIAGNOSIS — D510 Vitamin B12 deficiency anemia due to intrinsic factor deficiency: Secondary | ICD-10-CM | POA: Diagnosis not present

## 2021-04-11 DIAGNOSIS — E860 Dehydration: Secondary | ICD-10-CM | POA: Diagnosis not present

## 2021-04-11 DIAGNOSIS — Z79899 Other long term (current) drug therapy: Secondary | ICD-10-CM | POA: Diagnosis not present

## 2021-04-11 DIAGNOSIS — I509 Heart failure, unspecified: Secondary | ICD-10-CM | POA: Diagnosis not present

## 2021-04-11 DIAGNOSIS — I480 Paroxysmal atrial fibrillation: Secondary | ICD-10-CM | POA: Diagnosis not present

## 2021-04-11 DIAGNOSIS — Z7901 Long term (current) use of anticoagulants: Secondary | ICD-10-CM | POA: Diagnosis not present

## 2021-04-11 DIAGNOSIS — Z743 Need for continuous supervision: Secondary | ICD-10-CM | POA: Diagnosis not present

## 2021-04-11 DIAGNOSIS — E1165 Type 2 diabetes mellitus with hyperglycemia: Secondary | ICD-10-CM | POA: Diagnosis not present

## 2021-04-11 DIAGNOSIS — F32A Depression, unspecified: Secondary | ICD-10-CM | POA: Diagnosis not present

## 2021-04-11 DIAGNOSIS — K219 Gastro-esophageal reflux disease without esophagitis: Secondary | ICD-10-CM | POA: Diagnosis not present

## 2021-04-11 DIAGNOSIS — Z794 Long term (current) use of insulin: Secondary | ICD-10-CM | POA: Diagnosis not present

## 2021-04-11 DIAGNOSIS — A0811 Acute gastroenteropathy due to Norwalk agent: Secondary | ICD-10-CM | POA: Diagnosis not present

## 2021-04-11 DIAGNOSIS — I4891 Unspecified atrial fibrillation: Secondary | ICD-10-CM | POA: Diagnosis not present

## 2021-04-11 DIAGNOSIS — R112 Nausea with vomiting, unspecified: Secondary | ICD-10-CM | POA: Diagnosis not present

## 2021-04-11 DIAGNOSIS — F419 Anxiety disorder, unspecified: Secondary | ICD-10-CM | POA: Diagnosis not present

## 2021-04-11 DIAGNOSIS — A4189 Other specified sepsis: Secondary | ICD-10-CM | POA: Diagnosis not present

## 2021-04-11 DIAGNOSIS — J9811 Atelectasis: Secondary | ICD-10-CM | POA: Diagnosis not present

## 2021-04-19 DIAGNOSIS — R531 Weakness: Secondary | ICD-10-CM | POA: Diagnosis not present

## 2021-04-19 DIAGNOSIS — Z6841 Body Mass Index (BMI) 40.0 and over, adult: Secondary | ICD-10-CM | POA: Diagnosis not present

## 2021-04-19 DIAGNOSIS — E860 Dehydration: Secondary | ICD-10-CM | POA: Diagnosis not present

## 2021-04-19 DIAGNOSIS — A0811 Acute gastroenteropathy due to Norwalk agent: Secondary | ICD-10-CM | POA: Diagnosis not present

## 2021-04-27 DIAGNOSIS — Z794 Long term (current) use of insulin: Secondary | ICD-10-CM | POA: Diagnosis not present

## 2021-04-27 DIAGNOSIS — R252 Cramp and spasm: Secondary | ICD-10-CM | POA: Diagnosis not present

## 2021-04-27 DIAGNOSIS — E559 Vitamin D deficiency, unspecified: Secondary | ICD-10-CM | POA: Diagnosis not present

## 2021-04-27 DIAGNOSIS — E785 Hyperlipidemia, unspecified: Secondary | ICD-10-CM | POA: Diagnosis not present

## 2021-04-27 DIAGNOSIS — I10 Essential (primary) hypertension: Secondary | ICD-10-CM | POA: Diagnosis not present

## 2021-04-27 DIAGNOSIS — E1165 Type 2 diabetes mellitus with hyperglycemia: Secondary | ICD-10-CM | POA: Diagnosis not present

## 2021-04-30 ENCOUNTER — Other Ambulatory Visit: Payer: Self-pay | Admitting: Cardiology

## 2021-06-04 DIAGNOSIS — B9689 Other specified bacterial agents as the cause of diseases classified elsewhere: Secondary | ICD-10-CM | POA: Diagnosis not present

## 2021-06-04 DIAGNOSIS — J208 Acute bronchitis due to other specified organisms: Secondary | ICD-10-CM | POA: Diagnosis not present

## 2021-07-09 ENCOUNTER — Other Ambulatory Visit: Payer: Self-pay | Admitting: Cardiology

## 2021-07-09 NOTE — Telephone Encounter (Signed)
Prescription refill request for Eliquis received. Indication:Aflutter Last office visit:7/22 Scr:0.5 Age: 50 Weight:124.3 kg  Prescription refilled

## 2021-08-10 ENCOUNTER — Other Ambulatory Visit: Payer: Self-pay | Admitting: Cardiology

## 2021-08-27 DIAGNOSIS — R509 Fever, unspecified: Secondary | ICD-10-CM | POA: Diagnosis not present

## 2021-08-31 DIAGNOSIS — I10 Essential (primary) hypertension: Secondary | ICD-10-CM | POA: Diagnosis not present

## 2021-08-31 DIAGNOSIS — E785 Hyperlipidemia, unspecified: Secondary | ICD-10-CM | POA: Diagnosis not present

## 2021-08-31 DIAGNOSIS — E1165 Type 2 diabetes mellitus with hyperglycemia: Secondary | ICD-10-CM | POA: Diagnosis not present

## 2021-08-31 DIAGNOSIS — D649 Anemia, unspecified: Secondary | ICD-10-CM | POA: Diagnosis not present

## 2021-08-31 DIAGNOSIS — Z794 Long term (current) use of insulin: Secondary | ICD-10-CM | POA: Diagnosis not present

## 2021-12-10 DIAGNOSIS — Z7901 Long term (current) use of anticoagulants: Secondary | ICD-10-CM | POA: Diagnosis not present

## 2021-12-10 DIAGNOSIS — I340 Nonrheumatic mitral (valve) insufficiency: Secondary | ICD-10-CM | POA: Diagnosis not present

## 2021-12-10 DIAGNOSIS — Z7984 Long term (current) use of oral hypoglycemic drugs: Secondary | ICD-10-CM | POA: Diagnosis not present

## 2021-12-10 DIAGNOSIS — M25512 Pain in left shoulder: Secondary | ICD-10-CM | POA: Diagnosis not present

## 2021-12-10 DIAGNOSIS — N3090 Cystitis, unspecified without hematuria: Secondary | ICD-10-CM | POA: Diagnosis not present

## 2021-12-10 DIAGNOSIS — R519 Headache, unspecified: Secondary | ICD-10-CM | POA: Diagnosis not present

## 2021-12-10 DIAGNOSIS — R9431 Abnormal electrocardiogram [ECG] [EKG]: Secondary | ICD-10-CM | POA: Diagnosis not present

## 2021-12-10 DIAGNOSIS — I6523 Occlusion and stenosis of bilateral carotid arteries: Secondary | ICD-10-CM | POA: Diagnosis not present

## 2021-12-10 DIAGNOSIS — R202 Paresthesia of skin: Secondary | ICD-10-CM | POA: Diagnosis not present

## 2021-12-10 DIAGNOSIS — I509 Heart failure, unspecified: Secondary | ICD-10-CM | POA: Diagnosis not present

## 2021-12-10 DIAGNOSIS — I10 Essential (primary) hypertension: Secondary | ICD-10-CM | POA: Diagnosis not present

## 2021-12-10 DIAGNOSIS — B3731 Acute candidiasis of vulva and vagina: Secondary | ICD-10-CM | POA: Diagnosis not present

## 2021-12-10 DIAGNOSIS — J45909 Unspecified asthma, uncomplicated: Secondary | ICD-10-CM | POA: Diagnosis not present

## 2021-12-10 DIAGNOSIS — Z79899 Other long term (current) drug therapy: Secondary | ICD-10-CM | POA: Diagnosis not present

## 2021-12-10 DIAGNOSIS — R0902 Hypoxemia: Secondary | ICD-10-CM | POA: Diagnosis not present

## 2021-12-10 DIAGNOSIS — I6522 Occlusion and stenosis of left carotid artery: Secondary | ICD-10-CM | POA: Diagnosis not present

## 2021-12-10 DIAGNOSIS — R140 Abdominal distension (gaseous): Secondary | ICD-10-CM | POA: Diagnosis not present

## 2021-12-10 DIAGNOSIS — E1165 Type 2 diabetes mellitus with hyperglycemia: Secondary | ICD-10-CM | POA: Diagnosis not present

## 2021-12-10 DIAGNOSIS — R200 Anesthesia of skin: Secondary | ICD-10-CM | POA: Diagnosis not present

## 2021-12-10 DIAGNOSIS — I4811 Longstanding persistent atrial fibrillation: Secondary | ICD-10-CM | POA: Diagnosis not present

## 2021-12-10 DIAGNOSIS — E119 Type 2 diabetes mellitus without complications: Secondary | ICD-10-CM | POA: Diagnosis not present

## 2021-12-10 DIAGNOSIS — I110 Hypertensive heart disease with heart failure: Secondary | ICD-10-CM | POA: Diagnosis not present

## 2021-12-17 DIAGNOSIS — E1165 Type 2 diabetes mellitus with hyperglycemia: Secondary | ICD-10-CM | POA: Diagnosis not present

## 2021-12-17 DIAGNOSIS — M792 Neuralgia and neuritis, unspecified: Secondary | ICD-10-CM | POA: Diagnosis not present

## 2021-12-17 DIAGNOSIS — R200 Anesthesia of skin: Secondary | ICD-10-CM | POA: Diagnosis not present

## 2021-12-17 DIAGNOSIS — I10 Essential (primary) hypertension: Secondary | ICD-10-CM | POA: Diagnosis not present

## 2021-12-20 DIAGNOSIS — I10 Essential (primary) hypertension: Secondary | ICD-10-CM | POA: Diagnosis not present

## 2021-12-20 DIAGNOSIS — R0902 Hypoxemia: Secondary | ICD-10-CM | POA: Diagnosis not present

## 2021-12-20 DIAGNOSIS — R9431 Abnormal electrocardiogram [ECG] [EKG]: Secondary | ICD-10-CM | POA: Diagnosis not present

## 2021-12-20 DIAGNOSIS — R000 Tachycardia, unspecified: Secondary | ICD-10-CM | POA: Diagnosis not present

## 2021-12-20 DIAGNOSIS — Z7901 Long term (current) use of anticoagulants: Secondary | ICD-10-CM | POA: Diagnosis not present

## 2021-12-20 DIAGNOSIS — M79602 Pain in left arm: Secondary | ICD-10-CM | POA: Diagnosis not present

## 2021-12-20 DIAGNOSIS — Z7952 Long term (current) use of systemic steroids: Secondary | ICD-10-CM | POA: Diagnosis not present

## 2021-12-20 DIAGNOSIS — K219 Gastro-esophageal reflux disease without esophagitis: Secondary | ICD-10-CM | POA: Diagnosis not present

## 2021-12-20 DIAGNOSIS — M542 Cervicalgia: Secondary | ICD-10-CM | POA: Diagnosis not present

## 2021-12-20 DIAGNOSIS — M4802 Spinal stenosis, cervical region: Secondary | ICD-10-CM | POA: Diagnosis not present

## 2021-12-20 DIAGNOSIS — Z7984 Long term (current) use of oral hypoglycemic drugs: Secondary | ICD-10-CM | POA: Diagnosis not present

## 2021-12-20 DIAGNOSIS — R110 Nausea: Secondary | ICD-10-CM | POA: Diagnosis not present

## 2021-12-20 DIAGNOSIS — N882 Stricture and stenosis of cervix uteri: Secondary | ICD-10-CM | POA: Diagnosis not present

## 2021-12-20 DIAGNOSIS — J9811 Atelectasis: Secondary | ICD-10-CM | POA: Diagnosis not present

## 2021-12-20 DIAGNOSIS — Z79899 Other long term (current) drug therapy: Secondary | ICD-10-CM | POA: Diagnosis not present

## 2021-12-20 DIAGNOSIS — I4891 Unspecified atrial fibrillation: Secondary | ICD-10-CM | POA: Diagnosis not present

## 2021-12-20 DIAGNOSIS — R200 Anesthesia of skin: Secondary | ICD-10-CM | POA: Diagnosis not present

## 2021-12-20 DIAGNOSIS — E119 Type 2 diabetes mellitus without complications: Secondary | ICD-10-CM | POA: Diagnosis not present

## 2021-12-20 DIAGNOSIS — Z7982 Long term (current) use of aspirin: Secondary | ICD-10-CM | POA: Diagnosis not present

## 2021-12-21 DIAGNOSIS — R0902 Hypoxemia: Secondary | ICD-10-CM | POA: Diagnosis not present

## 2021-12-21 DIAGNOSIS — I4891 Unspecified atrial fibrillation: Secondary | ICD-10-CM | POA: Diagnosis not present

## 2021-12-21 DIAGNOSIS — M50223 Other cervical disc displacement at C6-C7 level: Secondary | ICD-10-CM | POA: Diagnosis not present

## 2021-12-21 DIAGNOSIS — Z79899 Other long term (current) drug therapy: Secondary | ICD-10-CM | POA: Diagnosis not present

## 2021-12-21 DIAGNOSIS — E119 Type 2 diabetes mellitus without complications: Secondary | ICD-10-CM | POA: Diagnosis not present

## 2021-12-21 DIAGNOSIS — N882 Stricture and stenosis of cervix uteri: Secondary | ICD-10-CM | POA: Diagnosis not present

## 2021-12-21 DIAGNOSIS — R000 Tachycardia, unspecified: Secondary | ICD-10-CM | POA: Diagnosis not present

## 2021-12-21 DIAGNOSIS — M79602 Pain in left arm: Secondary | ICD-10-CM | POA: Diagnosis not present

## 2021-12-21 DIAGNOSIS — Z7984 Long term (current) use of oral hypoglycemic drugs: Secondary | ICD-10-CM | POA: Diagnosis not present

## 2021-12-21 DIAGNOSIS — Z7901 Long term (current) use of anticoagulants: Secondary | ICD-10-CM | POA: Diagnosis not present

## 2021-12-21 DIAGNOSIS — Z7952 Long term (current) use of systemic steroids: Secondary | ICD-10-CM | POA: Diagnosis not present

## 2021-12-21 DIAGNOSIS — K219 Gastro-esophageal reflux disease without esophagitis: Secondary | ICD-10-CM | POA: Diagnosis not present

## 2021-12-21 DIAGNOSIS — R110 Nausea: Secondary | ICD-10-CM | POA: Diagnosis not present

## 2021-12-21 DIAGNOSIS — R200 Anesthesia of skin: Secondary | ICD-10-CM | POA: Diagnosis not present

## 2021-12-21 DIAGNOSIS — I10 Essential (primary) hypertension: Secondary | ICD-10-CM | POA: Diagnosis not present

## 2021-12-21 DIAGNOSIS — Z7982 Long term (current) use of aspirin: Secondary | ICD-10-CM | POA: Diagnosis not present

## 2021-12-24 DIAGNOSIS — M5412 Radiculopathy, cervical region: Secondary | ICD-10-CM | POA: Diagnosis not present

## 2021-12-25 DIAGNOSIS — M5412 Radiculopathy, cervical region: Secondary | ICD-10-CM | POA: Diagnosis not present

## 2021-12-25 DIAGNOSIS — E1165 Type 2 diabetes mellitus with hyperglycemia: Secondary | ICD-10-CM | POA: Diagnosis not present

## 2021-12-25 DIAGNOSIS — I4892 Unspecified atrial flutter: Secondary | ICD-10-CM | POA: Diagnosis not present

## 2021-12-25 DIAGNOSIS — Z794 Long term (current) use of insulin: Secondary | ICD-10-CM | POA: Diagnosis not present

## 2022-01-02 DIAGNOSIS — Z9189 Other specified personal risk factors, not elsewhere classified: Secondary | ICD-10-CM

## 2022-01-02 DIAGNOSIS — M50123 Cervical disc disorder at C6-C7 level with radiculopathy: Secondary | ICD-10-CM | POA: Diagnosis not present

## 2022-01-02 DIAGNOSIS — Z7901 Long term (current) use of anticoagulants: Secondary | ICD-10-CM | POA: Diagnosis not present

## 2022-01-02 DIAGNOSIS — M5412 Radiculopathy, cervical region: Secondary | ICD-10-CM

## 2022-01-02 DIAGNOSIS — Z794 Long term (current) use of insulin: Secondary | ICD-10-CM | POA: Diagnosis not present

## 2022-01-02 DIAGNOSIS — M5020 Other cervical disc displacement, unspecified cervical region: Secondary | ICD-10-CM

## 2022-01-02 DIAGNOSIS — E119 Type 2 diabetes mellitus without complications: Secondary | ICD-10-CM | POA: Diagnosis not present

## 2022-01-02 HISTORY — DX: Other cervical disc displacement, unspecified cervical region: M50.20

## 2022-01-02 HISTORY — DX: Radiculopathy, cervical region: M54.12

## 2022-01-02 HISTORY — DX: Other specified personal risk factors, not elsewhere classified: Z91.89

## 2022-01-03 DIAGNOSIS — M50123 Cervical disc disorder at C6-C7 level with radiculopathy: Secondary | ICD-10-CM | POA: Diagnosis not present

## 2022-01-04 DIAGNOSIS — Z794 Long term (current) use of insulin: Secondary | ICD-10-CM | POA: Diagnosis not present

## 2022-01-04 DIAGNOSIS — I10 Essential (primary) hypertension: Secondary | ICD-10-CM | POA: Diagnosis not present

## 2022-01-04 DIAGNOSIS — E1165 Type 2 diabetes mellitus with hyperglycemia: Secondary | ICD-10-CM | POA: Diagnosis not present

## 2022-01-04 DIAGNOSIS — E785 Hyperlipidemia, unspecified: Secondary | ICD-10-CM | POA: Diagnosis not present

## 2022-01-04 DIAGNOSIS — M5412 Radiculopathy, cervical region: Secondary | ICD-10-CM | POA: Diagnosis not present

## 2022-01-04 DIAGNOSIS — D649 Anemia, unspecified: Secondary | ICD-10-CM | POA: Diagnosis not present

## 2022-01-09 DIAGNOSIS — R251 Tremor, unspecified: Secondary | ICD-10-CM | POA: Diagnosis not present

## 2022-01-09 DIAGNOSIS — R4781 Slurred speech: Secondary | ICD-10-CM | POA: Diagnosis not present

## 2022-01-09 DIAGNOSIS — I4811 Longstanding persistent atrial fibrillation: Secondary | ICD-10-CM | POA: Diagnosis not present

## 2022-01-09 DIAGNOSIS — M5020 Other cervical disc displacement, unspecified cervical region: Secondary | ICD-10-CM | POA: Diagnosis not present

## 2022-01-09 DIAGNOSIS — M5412 Radiculopathy, cervical region: Secondary | ICD-10-CM | POA: Diagnosis not present

## 2022-01-09 DIAGNOSIS — R29898 Other symptoms and signs involving the musculoskeletal system: Secondary | ICD-10-CM | POA: Diagnosis not present

## 2022-01-09 DIAGNOSIS — E1165 Type 2 diabetes mellitus with hyperglycemia: Secondary | ICD-10-CM | POA: Diagnosis not present

## 2022-01-09 DIAGNOSIS — R200 Anesthesia of skin: Secondary | ICD-10-CM | POA: Diagnosis not present

## 2022-01-10 DIAGNOSIS — R2990 Unspecified symptoms and signs involving the nervous system: Secondary | ICD-10-CM | POA: Diagnosis not present

## 2022-01-10 DIAGNOSIS — M25522 Pain in left elbow: Secondary | ICD-10-CM | POA: Diagnosis not present

## 2022-01-10 DIAGNOSIS — R29818 Other symptoms and signs involving the nervous system: Secondary | ICD-10-CM | POA: Diagnosis not present

## 2022-01-10 DIAGNOSIS — W1789XA Other fall from one level to another, initial encounter: Secondary | ICD-10-CM | POA: Diagnosis not present

## 2022-01-10 DIAGNOSIS — Y999 Unspecified external cause status: Secondary | ICD-10-CM | POA: Diagnosis not present

## 2022-01-10 DIAGNOSIS — M25571 Pain in right ankle and joints of right foot: Secondary | ICD-10-CM | POA: Diagnosis not present

## 2022-01-15 ENCOUNTER — Encounter: Payer: Self-pay | Admitting: *Deleted

## 2022-01-15 ENCOUNTER — Encounter: Payer: Self-pay | Admitting: Cardiology

## 2022-01-15 DIAGNOSIS — I4811 Longstanding persistent atrial fibrillation: Secondary | ICD-10-CM | POA: Insufficient documentation

## 2022-01-15 DIAGNOSIS — N3090 Cystitis, unspecified without hematuria: Secondary | ICD-10-CM

## 2022-01-15 DIAGNOSIS — E1165 Type 2 diabetes mellitus with hyperglycemia: Secondary | ICD-10-CM

## 2022-01-15 DIAGNOSIS — M542 Cervicalgia: Secondary | ICD-10-CM | POA: Diagnosis not present

## 2022-01-15 DIAGNOSIS — Z794 Long term (current) use of insulin: Secondary | ICD-10-CM | POA: Diagnosis not present

## 2022-01-15 DIAGNOSIS — R200 Anesthesia of skin: Secondary | ICD-10-CM

## 2022-01-15 DIAGNOSIS — F4322 Adjustment disorder with anxiety: Secondary | ICD-10-CM | POA: Diagnosis not present

## 2022-01-15 DIAGNOSIS — Z6841 Body Mass Index (BMI) 40.0 and over, adult: Secondary | ICD-10-CM | POA: Diagnosis not present

## 2022-01-15 DIAGNOSIS — M50123 Cervical disc disorder at C6-C7 level with radiculopathy: Secondary | ICD-10-CM | POA: Diagnosis not present

## 2022-01-15 DIAGNOSIS — F331 Major depressive disorder, recurrent, moderate: Secondary | ICD-10-CM | POA: Diagnosis not present

## 2022-01-15 DIAGNOSIS — I10 Essential (primary) hypertension: Secondary | ICD-10-CM | POA: Diagnosis not present

## 2022-01-15 DIAGNOSIS — M25512 Pain in left shoulder: Secondary | ICD-10-CM

## 2022-01-15 DIAGNOSIS — R6889 Other general symptoms and signs: Secondary | ICD-10-CM

## 2022-01-15 HISTORY — DX: Longstanding persistent atrial fibrillation: I48.11

## 2022-01-15 HISTORY — DX: Cystitis, unspecified without hematuria: N30.90

## 2022-01-15 HISTORY — DX: Type 2 diabetes mellitus with hyperglycemia: E11.65

## 2022-01-15 HISTORY — DX: Pain in left shoulder: M25.512

## 2022-01-15 HISTORY — DX: Anesthesia of skin: R20.0

## 2022-01-15 HISTORY — DX: Other general symptoms and signs: R68.89

## 2022-01-16 ENCOUNTER — Encounter: Payer: Self-pay | Admitting: *Deleted

## 2022-01-16 ENCOUNTER — Encounter: Payer: Self-pay | Admitting: Cardiology

## 2022-01-16 DIAGNOSIS — I10 Essential (primary) hypertension: Secondary | ICD-10-CM | POA: Diagnosis not present

## 2022-01-16 DIAGNOSIS — M542 Cervicalgia: Secondary | ICD-10-CM | POA: Diagnosis not present

## 2022-01-16 DIAGNOSIS — M50123 Cervical disc disorder at C6-C7 level with radiculopathy: Secondary | ICD-10-CM | POA: Diagnosis not present

## 2022-01-16 DIAGNOSIS — Z6841 Body Mass Index (BMI) 40.0 and over, adult: Secondary | ICD-10-CM | POA: Diagnosis not present

## 2022-01-17 ENCOUNTER — Encounter: Payer: Self-pay | Admitting: Nurse Practitioner

## 2022-01-17 ENCOUNTER — Ambulatory Visit (INDEPENDENT_AMBULATORY_CARE_PROVIDER_SITE_OTHER): Payer: BC Managed Care – PPO | Admitting: Nurse Practitioner

## 2022-01-17 VITALS — BP 118/68 | HR 96 | Ht 66.0 in | Wt 268.2 lb

## 2022-01-17 DIAGNOSIS — I10 Essential (primary) hypertension: Secondary | ICD-10-CM

## 2022-01-17 DIAGNOSIS — I5032 Chronic diastolic (congestive) heart failure: Secondary | ICD-10-CM

## 2022-01-17 DIAGNOSIS — I4892 Unspecified atrial flutter: Secondary | ICD-10-CM | POA: Diagnosis not present

## 2022-01-17 DIAGNOSIS — E782 Mixed hyperlipidemia: Secondary | ICD-10-CM | POA: Diagnosis not present

## 2022-01-17 DIAGNOSIS — Z6841 Body Mass Index (BMI) 40.0 and over, adult: Secondary | ICD-10-CM

## 2022-01-17 DIAGNOSIS — Z01818 Encounter for other preprocedural examination: Secondary | ICD-10-CM

## 2022-01-17 DIAGNOSIS — M5020 Other cervical disc displacement, unspecified cervical region: Secondary | ICD-10-CM

## 2022-01-17 DIAGNOSIS — E1165 Type 2 diabetes mellitus with hyperglycemia: Secondary | ICD-10-CM

## 2022-01-17 DIAGNOSIS — M5412 Radiculopathy, cervical region: Secondary | ICD-10-CM

## 2022-01-17 DIAGNOSIS — Z01810 Encounter for preprocedural cardiovascular examination: Secondary | ICD-10-CM

## 2022-01-17 NOTE — Patient Instructions (Addendum)
Medication Instructions:  HOLD Eliquis for 3 days prior to procedure and RESTART as soon as possible  *If you need a refill on your cardiac medications before your next appointment, please call your pharmacy*  Lab Work: NONE ordered at this time of appointment   If you have labs (blood work) drawn today and your tests are completely normal, you will receive your results only by: MyChart Message (if you have MyChart) OR A paper copy in the mail If you have any lab test that is abnormal or we need to change your treatment, we will call you to review the results.  Testing/Procedures: NONE ordered at this time of appointment   Follow-Up: At [facility], you and your health needs are our priority.  As part of our continuing mission to provide you with exceptional heart care, we have created designated Provider Care Teams.  These Care Teams include your primary Cardiologist (physician) and Advanced Practice Providers (APPs -  Physician Assistants and Nurse Practitioners) who all work together to provide you with the care you need, when you need it.  We recommend signing up for the patient portal called "MyChart".  Sign up information is provided on this After Visit Summary.  MyChart is used to connect with patients for Virtual Visits (Telemedicine).  Patients are able to view lab/test results, encounter notes, upcoming appointments, etc.  Non-urgent messages can be sent to your provider as well.   To learn more about what you can do with MyChart, go to [email or web address].    Your next appointment:   6 month(s)  The format for your next appointment:   In Person  Provider:   Norman Herrlich, MD    Other Instructions   Important Information About Sugar
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
## 2022-01-28 DIAGNOSIS — F331 Major depressive disorder, recurrent, moderate: Secondary | ICD-10-CM | POA: Diagnosis not present

## 2022-01-28 DIAGNOSIS — F4322 Adjustment disorder with anxiety: Secondary | ICD-10-CM | POA: Diagnosis not present

## 2022-01-28 DIAGNOSIS — Z794 Long term (current) use of insulin: Secondary | ICD-10-CM | POA: Diagnosis not present

## 2022-01-28 DIAGNOSIS — E1165 Type 2 diabetes mellitus with hyperglycemia: Secondary | ICD-10-CM | POA: Diagnosis not present

## 2022-02-07 DIAGNOSIS — Z7901 Long term (current) use of anticoagulants: Secondary | ICD-10-CM | POA: Diagnosis not present

## 2022-02-07 DIAGNOSIS — E1142 Type 2 diabetes mellitus with diabetic polyneuropathy: Secondary | ICD-10-CM | POA: Diagnosis not present

## 2022-02-07 DIAGNOSIS — Z01812 Encounter for preprocedural laboratory examination: Secondary | ICD-10-CM | POA: Diagnosis not present

## 2022-02-07 DIAGNOSIS — M50123 Cervical disc disorder at C6-C7 level with radiculopathy: Secondary | ICD-10-CM | POA: Diagnosis not present

## 2022-02-07 DIAGNOSIS — Z6841 Body Mass Index (BMI) 40.0 and over, adult: Secondary | ICD-10-CM | POA: Diagnosis not present

## 2022-02-07 DIAGNOSIS — M50023 Cervical disc disorder at C6-C7 level with myelopathy: Secondary | ICD-10-CM | POA: Diagnosis not present

## 2022-02-07 DIAGNOSIS — E669 Obesity, unspecified: Secondary | ICD-10-CM | POA: Diagnosis not present

## 2022-02-07 DIAGNOSIS — K219 Gastro-esophageal reflux disease without esophagitis: Secondary | ICD-10-CM | POA: Diagnosis not present

## 2022-02-07 DIAGNOSIS — Z794 Long term (current) use of insulin: Secondary | ICD-10-CM | POA: Diagnosis not present

## 2022-02-18 DIAGNOSIS — Z981 Arthrodesis status: Secondary | ICD-10-CM | POA: Diagnosis not present

## 2022-02-18 DIAGNOSIS — M2578 Osteophyte, vertebrae: Secondary | ICD-10-CM | POA: Diagnosis not present

## 2022-02-18 DIAGNOSIS — Z4682 Encounter for fitting and adjustment of non-vascular catheter: Secondary | ICD-10-CM | POA: Diagnosis not present

## 2022-02-18 DIAGNOSIS — M50023 Cervical disc disorder at C6-C7 level with myelopathy: Secondary | ICD-10-CM | POA: Diagnosis not present

## 2022-02-18 DIAGNOSIS — M4322 Fusion of spine, cervical region: Secondary | ICD-10-CM | POA: Diagnosis not present

## 2022-02-18 DIAGNOSIS — M50123 Cervical disc disorder at C6-C7 level with radiculopathy: Secondary | ICD-10-CM | POA: Diagnosis not present

## 2022-02-19 DIAGNOSIS — M50123 Cervical disc disorder at C6-C7 level with radiculopathy: Secondary | ICD-10-CM | POA: Diagnosis not present

## 2022-02-19 DIAGNOSIS — M50023 Cervical disc disorder at C6-C7 level with myelopathy: Secondary | ICD-10-CM | POA: Diagnosis not present

## 2022-02-19 DIAGNOSIS — M4322 Fusion of spine, cervical region: Secondary | ICD-10-CM | POA: Diagnosis not present

## 2022-02-19 DIAGNOSIS — M2578 Osteophyte, vertebrae: Secondary | ICD-10-CM | POA: Diagnosis not present

## 2022-02-19 DIAGNOSIS — Z981 Arthrodesis status: Secondary | ICD-10-CM | POA: Diagnosis not present

## 2022-02-25 ENCOUNTER — Ambulatory Visit: Payer: BC Managed Care – PPO | Admitting: Cardiology

## 2022-03-01 DIAGNOSIS — M5412 Radiculopathy, cervical region: Secondary | ICD-10-CM | POA: Diagnosis not present

## 2022-03-01 DIAGNOSIS — K1379 Other lesions of oral mucosa: Secondary | ICD-10-CM | POA: Diagnosis not present

## 2022-03-01 DIAGNOSIS — E1165 Type 2 diabetes mellitus with hyperglycemia: Secondary | ICD-10-CM | POA: Diagnosis not present

## 2022-03-01 DIAGNOSIS — Z794 Long term (current) use of insulin: Secondary | ICD-10-CM | POA: Diagnosis not present

## 2022-03-13 ENCOUNTER — Telehealth: Payer: Self-pay | Admitting: Cardiology

## 2022-03-13 MED ORDER — MULTAQ 400 MG PO TABS: 400.0000 mg | ORAL_TABLET | Freq: Two times a day (BID) | ORAL | 1 refills | Status: DC

## 2022-03-13 NOTE — Telephone Encounter (Signed)
*  STAT* If patient is at the pharmacy, call can be transferred to refill team.   1. Which medications need to be refilled? (please list name of each medication and dose if known) dronedarone (MULTAQ) 400 MG tablet  2. Which pharmacy/location (including street and city if local pharmacy) is medication to be sent to? [id number] - [place], [place] - [address]  3. Do they need a 30 day or 90 day supply?  90 day supply  She is completely out of medication

## 2022-03-13 NOTE — Telephone Encounter (Signed)
Rx refill sent to pharmacy. 

## 2022-03-18 DIAGNOSIS — M50123 Cervical disc disorder at C6-C7 level with radiculopathy: Secondary | ICD-10-CM | POA: Diagnosis not present

## 2022-03-18 DIAGNOSIS — Z6841 Body Mass Index (BMI) 40.0 and over, adult: Secondary | ICD-10-CM | POA: Diagnosis not present

## 2022-03-18 DIAGNOSIS — M50023 Cervical disc disorder at C6-C7 level with myelopathy: Secondary | ICD-10-CM | POA: Diagnosis not present

## 2022-03-22 DIAGNOSIS — F331 Major depressive disorder, recurrent, moderate: Secondary | ICD-10-CM | POA: Diagnosis not present

## 2022-03-22 DIAGNOSIS — M5412 Radiculopathy, cervical region: Secondary | ICD-10-CM | POA: Diagnosis not present

## 2022-03-22 DIAGNOSIS — Z794 Long term (current) use of insulin: Secondary | ICD-10-CM | POA: Diagnosis not present

## 2022-03-22 DIAGNOSIS — E1165 Type 2 diabetes mellitus with hyperglycemia: Secondary | ICD-10-CM | POA: Diagnosis not present

## 2022-04-12 ENCOUNTER — Other Ambulatory Visit: Payer: Self-pay | Admitting: Cardiology

## 2022-04-19 ENCOUNTER — Other Ambulatory Visit: Payer: Self-pay | Admitting: Cardiology

## 2022-04-19 DIAGNOSIS — I4892 Unspecified atrial flutter: Secondary | ICD-10-CM

## 2022-04-19 NOTE — Telephone Encounter (Signed)
Prescription refill request for Eliquis received. Indication: Aflutter Last office visit:01/17/22 ([place])  Scr: 0.53 (02/19/22)  Age: 51 Weight: 121.7kg  Appropriate dose and refill sent to requested pharmacy.

## 2022-04-27 ENCOUNTER — Other Ambulatory Visit: Payer: Self-pay | Admitting: Cardiology

## 2022-05-15 DIAGNOSIS — M50023 Cervical disc disorder at C6-C7 level with myelopathy: Secondary | ICD-10-CM | POA: Diagnosis not present

## 2022-05-20 DIAGNOSIS — E785 Hyperlipidemia, unspecified: Secondary | ICD-10-CM | POA: Diagnosis not present

## 2022-05-20 DIAGNOSIS — Z794 Long term (current) use of insulin: Secondary | ICD-10-CM | POA: Diagnosis not present

## 2022-05-20 DIAGNOSIS — I10 Essential (primary) hypertension: Secondary | ICD-10-CM | POA: Diagnosis not present

## 2022-05-20 DIAGNOSIS — E1165 Type 2 diabetes mellitus with hyperglycemia: Secondary | ICD-10-CM | POA: Diagnosis not present

## 2022-07-26 ENCOUNTER — Other Ambulatory Visit: Payer: Self-pay | Admitting: Cardiology

## 2022-08-06 ENCOUNTER — Telehealth: Payer: Self-pay | Admitting: Cardiology

## 2022-08-06 MED ORDER — LISINOPRIL 5 MG PO TABS: 5.0000 mg | ORAL_TABLET | Freq: Every day | ORAL | 1 refills | Status: DC

## 2022-08-06 NOTE — Telephone Encounter (Signed)
*  STAT* If patient is at the pharmacy, call can be transferred to refill team.   1. Which medications need to be refilled? (please list name of each medication and dose if known)  lisinopril (ZESTRIL) 5 MG tablet  2. Which pharmacy/location (including street and city if local pharmacy) is medication to be sent to? [place], [place]  3. Do they need a 30 day or 90 day supply?  30 day supply

## 2022-08-19 DIAGNOSIS — M4322 Fusion of spine, cervical region: Secondary | ICD-10-CM | POA: Diagnosis not present

## 2022-08-19 DIAGNOSIS — M50023 Cervical disc disorder at C6-C7 level with myelopathy: Secondary | ICD-10-CM | POA: Diagnosis not present

## 2022-08-19 DIAGNOSIS — Z6841 Body Mass Index (BMI) 40.0 and over, adult: Secondary | ICD-10-CM | POA: Diagnosis not present

## 2022-08-19 DIAGNOSIS — M542 Cervicalgia: Secondary | ICD-10-CM | POA: Diagnosis not present

## 2022-08-20 ENCOUNTER — Other Ambulatory Visit: Payer: Self-pay | Admitting: Cardiology

## 2022-08-30 DIAGNOSIS — E1165 Type 2 diabetes mellitus with hyperglycemia: Secondary | ICD-10-CM | POA: Diagnosis not present

## 2022-08-30 DIAGNOSIS — I10 Essential (primary) hypertension: Secondary | ICD-10-CM | POA: Diagnosis not present

## 2022-08-30 DIAGNOSIS — E039 Hypothyroidism, unspecified: Secondary | ICD-10-CM | POA: Diagnosis not present

## 2022-08-30 DIAGNOSIS — E785 Hyperlipidemia, unspecified: Secondary | ICD-10-CM | POA: Diagnosis not present

## 2022-08-30 DIAGNOSIS — Z794 Long term (current) use of insulin: Secondary | ICD-10-CM | POA: Diagnosis not present

## 2022-09-10 DIAGNOSIS — J019 Acute sinusitis, unspecified: Secondary | ICD-10-CM | POA: Diagnosis not present

## 2022-09-10 DIAGNOSIS — R509 Fever, unspecified: Secondary | ICD-10-CM | POA: Diagnosis not present

## 2022-09-17 ENCOUNTER — Ambulatory Visit: Payer: BC Managed Care – PPO | Admitting: Cardiology

## 2022-09-25 ENCOUNTER — Telehealth: Payer: Self-pay | Admitting: Cardiology

## 2022-09-25 NOTE — Telephone Encounter (Signed)
*  STAT* If patient is at the pharmacy, call can be transferred to refill team.   1. Which medications need to be refilled? (please list name of each medication and dose if known)  dronedarone (MULTAQ) 400 MG tablet  2. Which pharmacy/location (including street and city if local pharmacy) is medication to be sent to? [place], [place]   3. Do they need a 30 day or 90 day supply?  90 day supply

## 2022-09-26 ENCOUNTER — Other Ambulatory Visit: Payer: Self-pay

## 2022-09-26 MED ORDER — MULTAQ 400 MG PO TABS: 400.0000 mg | ORAL_TABLET | Freq: Two times a day (BID) | ORAL | 0 refills | Status: DC

## 2022-09-26 NOTE — Telephone Encounter (Signed)
Medication sent with final instruction must arrange appt for additional refill

## 2022-09-26 NOTE — Telephone Encounter (Addendum)
Patient has appt on 12/03/22, please send additional medication. Please advise

## 2022-09-26 NOTE — Telephone Encounter (Signed)
Left message for the patient to call back.

## 2022-09-27 MED ORDER — MULTAQ 400 MG PO TABS: 400.0000 mg | ORAL_TABLET | Freq: Two times a day (BID) | ORAL | 0 refills | Status: DC

## 2022-09-27 NOTE — Telephone Encounter (Signed)
I sent a 30 day supply and will refill on a monthly basis until her appt is meet. Final warning.

## 2022-09-27 NOTE — Addendum Note (Signed)
Addended by: Darrel Reach on: 09/27/2022 10:03 AM   Modules accepted: Orders

## 2022-10-12 ENCOUNTER — Other Ambulatory Visit: Payer: Self-pay | Admitting: Cardiology

## 2022-11-29 DIAGNOSIS — Z794 Long term (current) use of insulin: Secondary | ICD-10-CM | POA: Diagnosis not present

## 2022-11-29 DIAGNOSIS — E785 Hyperlipidemia, unspecified: Secondary | ICD-10-CM | POA: Diagnosis not present

## 2022-11-29 DIAGNOSIS — I10 Essential (primary) hypertension: Secondary | ICD-10-CM | POA: Diagnosis not present

## 2022-11-29 DIAGNOSIS — E1165 Type 2 diabetes mellitus with hyperglycemia: Secondary | ICD-10-CM | POA: Diagnosis not present

## 2022-11-30 ENCOUNTER — Other Ambulatory Visit: Payer: Self-pay | Admitting: Cardiology
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
## 2022-12-02 NOTE — Telephone Encounter (Signed)
Patient has appointment 12/03/22.

## 2022-12-03 ENCOUNTER — Ambulatory Visit: Payer: BC Managed Care – PPO | Attending: Cardiology | Admitting: Cardiology

## 2022-12-03 ENCOUNTER — Encounter: Payer: Self-pay | Admitting: Cardiology

## 2022-12-03 VITALS — BP 120/78 | HR 78 | Ht 66.0 in | Wt 263.6 lb

## 2022-12-03 DIAGNOSIS — Z7901 Long term (current) use of anticoagulants: Secondary | ICD-10-CM

## 2022-12-03 DIAGNOSIS — Z79899 Other long term (current) drug therapy: Secondary | ICD-10-CM

## 2022-12-03 DIAGNOSIS — I10 Essential (primary) hypertension: Secondary | ICD-10-CM

## 2022-12-03 DIAGNOSIS — I4892 Unspecified atrial flutter: Secondary | ICD-10-CM

## 2022-12-03 NOTE — Patient Instructions (Signed)
Medication Instructions:  Your physician has recommended you make the following change in your medication:   Your physician recommends that you continue on your current medications as directed. Please refer to the Current Medication list given to you today.   *If you need a refill on your cardiac medications before your next appointment, please call your pharmacy*   Lab Work: None If you have labs (blood work) drawn today and your tests are completely normal, you will receive your results only by: MyChart Message (if you have MyChart) OR A paper copy in the mail If you have any lab test that is abnormal or we need to change your treatment, we will call you to review the results.   Testing/Procedures: None   Follow-Up: At [facility], you and your health needs are our priority.  As part of our continuing mission to provide you with exceptional heart care, we have created designated Provider Care Teams.  These Care Teams include your primary Cardiologist (physician) and Advanced Practice Providers (APPs -  Physician Assistants and Nurse Practitioners) who all work together to provide you with the care you need, when you need it.  We recommend signing up for the patient portal called "MyChart".  Sign up information is provided on this After Visit Summary.  MyChart is used to connect with patients for Virtual Visits (Telemedicine).  Patients are able to view lab/test results, encounter notes, upcoming appointments, etc.  Non-urgent messages can be sent to your provider as well.   To learn more about what you can do with MyChart, go to [email or web address].    Your next appointment:   6 month(s)  Provider:   Norman Herrlich, MD    Other Instructions None

## 2022-12-03 NOTE — Telephone Encounter (Signed)
Multaq 400 mg # 180 x 3 refills sent to pharmacy

## 2023-01-01 DIAGNOSIS — N939 Abnormal uterine and vaginal bleeding, unspecified: Secondary | ICD-10-CM | POA: Diagnosis not present

## 2023-01-01 DIAGNOSIS — N83292 Other ovarian cyst, left side: Secondary | ICD-10-CM | POA: Diagnosis not present

## 2023-01-01 DIAGNOSIS — Z9889 Other specified postprocedural states: Secondary | ICD-10-CM | POA: Diagnosis not present

## 2023-01-01 DIAGNOSIS — Z8742 Personal history of other diseases of the female genital tract: Secondary | ICD-10-CM | POA: Diagnosis not present

## 2023-01-01 DIAGNOSIS — D649 Anemia, unspecified: Secondary | ICD-10-CM | POA: Diagnosis not present

## 2023-01-01 DIAGNOSIS — N72 Inflammatory disease of cervix uteri: Secondary | ICD-10-CM | POA: Diagnosis not present

## 2023-01-01 DIAGNOSIS — Z90711 Acquired absence of uterus with remaining cervical stump: Secondary | ICD-10-CM | POA: Diagnosis not present

## 2023-01-01 DIAGNOSIS — Z124 Encounter for screening for malignant neoplasm of cervix: Secondary | ICD-10-CM | POA: Diagnosis not present

## 2023-01-01 DIAGNOSIS — N840 Polyp of corpus uteri: Secondary | ICD-10-CM | POA: Diagnosis not present

## 2023-01-20 DIAGNOSIS — J208 Acute bronchitis due to other specified organisms: Secondary | ICD-10-CM | POA: Diagnosis not present

## 2023-02-07 ENCOUNTER — Other Ambulatory Visit: Payer: Self-pay | Admitting: Cardiology

## 2023-03-11 DIAGNOSIS — D649 Anemia, unspecified: Secondary | ICD-10-CM | POA: Diagnosis not present

## 2023-03-11 DIAGNOSIS — Z794 Long term (current) use of insulin: Secondary | ICD-10-CM | POA: Diagnosis not present

## 2023-03-11 DIAGNOSIS — E785 Hyperlipidemia, unspecified: Secondary | ICD-10-CM | POA: Diagnosis not present

## 2023-03-11 DIAGNOSIS — E1165 Type 2 diabetes mellitus with hyperglycemia: Secondary | ICD-10-CM | POA: Diagnosis not present

## 2023-03-11 DIAGNOSIS — F331 Major depressive disorder, recurrent, moderate: Secondary | ICD-10-CM | POA: Diagnosis not present

## 2023-03-11 DIAGNOSIS — I10 Essential (primary) hypertension: Secondary | ICD-10-CM | POA: Diagnosis not present

## 2023-03-11 DIAGNOSIS — E039 Hypothyroidism, unspecified: Secondary | ICD-10-CM | POA: Diagnosis not present

## 2023-05-05 ENCOUNTER — Other Ambulatory Visit: Payer: Self-pay

## 2023-05-05 ENCOUNTER — Telehealth: Payer: Self-pay | Admitting: Cardiology

## 2023-05-05 MED ORDER — DILTIAZEM HCL ER COATED BEADS 180 MG PO CP24: 180.0000 mg | ORAL_CAPSULE | Freq: Every day | ORAL | 3 refills | Status: DC

## 2023-05-05 NOTE — Telephone Encounter (Signed)
Called the patient and left a detailed message explaining that her Cardizem medication had been refilled. Instructed the patient to give [place] a call if she had any questions.

## 2023-05-05 NOTE — Telephone Encounter (Signed)
*  STAT* If patient is at the pharmacy, call can be transferred to refill team.   1. Which medications need to be refilled? (please list name of each medication and dose if known)   diltiazem (CARDIZEM CD) 180 MG 24 hr capsule   2. Would you like to learn more about the convenience, safety, & potential cost savings by using the [facility]?   3. Are you open to using the [facility] (Type [facility]. ).  4. Which pharmacy/location (including street and city if local pharmacy) is medication to be sent to?  [facility] - , [place] - [address]   5. Do they need a 30 day or 90 day supply?   30 day  Patient stated she only has 2 capsules left.

## 2023-06-20 DIAGNOSIS — Z794 Long term (current) use of insulin: Secondary | ICD-10-CM | POA: Diagnosis not present

## 2023-06-20 DIAGNOSIS — F331 Major depressive disorder, recurrent, moderate: Secondary | ICD-10-CM | POA: Diagnosis not present

## 2023-06-20 DIAGNOSIS — E1165 Type 2 diabetes mellitus with hyperglycemia: Secondary | ICD-10-CM | POA: Diagnosis not present

## 2023-06-20 DIAGNOSIS — D649 Anemia, unspecified: Secondary | ICD-10-CM | POA: Diagnosis not present

## 2023-06-20 DIAGNOSIS — E785 Hyperlipidemia, unspecified: Secondary | ICD-10-CM | POA: Diagnosis not present

## 2023-06-20 DIAGNOSIS — I4892 Unspecified atrial flutter: Secondary | ICD-10-CM | POA: Diagnosis not present

## 2023-06-20 DIAGNOSIS — I10 Essential (primary) hypertension: Secondary | ICD-10-CM | POA: Diagnosis not present

## 2023-06-20 DIAGNOSIS — E039 Hypothyroidism, unspecified: Secondary | ICD-10-CM | POA: Diagnosis not present

## 2023-06-20 DIAGNOSIS — D509 Iron deficiency anemia, unspecified: Secondary | ICD-10-CM | POA: Diagnosis not present

## 2023-08-22 DIAGNOSIS — I4892 Unspecified atrial flutter: Secondary | ICD-10-CM | POA: Diagnosis not present

## 2023-08-22 DIAGNOSIS — R059 Cough, unspecified: Secondary | ICD-10-CM | POA: Diagnosis not present

## 2023-08-22 DIAGNOSIS — J180 Bronchopneumonia, unspecified organism: Secondary | ICD-10-CM | POA: Diagnosis not present

## 2023-08-22 DIAGNOSIS — I10 Essential (primary) hypertension: Secondary | ICD-10-CM | POA: Diagnosis not present

## 2023-09-01 ENCOUNTER — Other Ambulatory Visit: Payer: Self-pay | Admitting: Cardiology

## 2023-09-11 DIAGNOSIS — E785 Hyperlipidemia, unspecified: Secondary | ICD-10-CM | POA: Insufficient documentation

## 2023-09-11 DIAGNOSIS — F32A Depression, unspecified: Secondary | ICD-10-CM | POA: Insufficient documentation

## 2023-09-11 DIAGNOSIS — E039 Hypothyroidism, unspecified: Secondary | ICD-10-CM | POA: Insufficient documentation

## 2023-09-11 DIAGNOSIS — D649 Anemia, unspecified: Secondary | ICD-10-CM | POA: Insufficient documentation

## 2023-09-11 DIAGNOSIS — K589 Irritable bowel syndrome without diarrhea: Secondary | ICD-10-CM | POA: Insufficient documentation
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
## 2023-09-16 ENCOUNTER — Ambulatory Visit: Payer: BC Managed Care – PPO | Attending: Cardiology | Admitting: Cardiology

## 2023-09-16 ENCOUNTER — Encounter: Payer: Self-pay | Admitting: Cardiology

## 2023-09-16 VITALS — BP 128/76 | HR 106 | Ht 66.0 in | Wt 250.8 lb

## 2023-09-16 DIAGNOSIS — I4892 Unspecified atrial flutter: Secondary | ICD-10-CM

## 2023-09-16 DIAGNOSIS — Z7901 Long term (current) use of anticoagulants: Secondary | ICD-10-CM | POA: Diagnosis not present

## 2023-09-16 DIAGNOSIS — I110 Hypertensive heart disease with heart failure: Secondary | ICD-10-CM | POA: Diagnosis not present

## 2023-09-16 DIAGNOSIS — Z79899 Other long term (current) drug therapy: Secondary | ICD-10-CM

## 2023-09-16 NOTE — Patient Instructions (Addendum)
6 monthMedication Instructions:  Your physician has recommended you make the following change in your medication:   STOP: Eliquis  *If you need a refill on your cardiac medications before your next appointment, please call your pharmacy*   Lab Work: None If you have labs (blood work) drawn today and your tests are completely normal, you will receive your results only by: MyChart Message (if you have MyChart) OR A paper copy in the mail If you have any lab test that is abnormal or we need to change your treatment, we will call you to review the results.   Testing/Procedures: None   Follow-Up: At [facility], you and your health needs are our priority.  As part of our continuing mission to provide you with exceptional heart care, we have created designated Provider Care Teams.  These Care Teams include your primary Cardiologist (physician) and Advanced Practice Providers (APPs -  Physician Assistants and Nurse Practitioners) who all work together to provide you with the care you need, when you need it.  We recommend signing up for the patient portal called "MyChart".  Sign up information is provided on this After Visit Summary.  MyChart is used to connect with patients for Virtual Visits (Telemedicine).  Patients are able to view lab/test results, encounter notes, upcoming appointments, etc.  Non-urgent messages can be sent to your provider as well.   To learn more about what you can do with MyChart, go to [email or web address].    Your next appointment:   6 month(s)  Provider:   Norman Herrlich, MD    Other Instructions None

## 2023-10-03 DIAGNOSIS — E1165 Type 2 diabetes mellitus with hyperglycemia: Secondary | ICD-10-CM | POA: Diagnosis not present

## 2023-10-03 DIAGNOSIS — E785 Hyperlipidemia, unspecified: Secondary | ICD-10-CM | POA: Diagnosis not present

## 2023-10-03 DIAGNOSIS — I10 Essential (primary) hypertension: Secondary | ICD-10-CM | POA: Diagnosis not present

## 2023-10-03 DIAGNOSIS — Z794 Long term (current) use of insulin: Secondary | ICD-10-CM | POA: Diagnosis not present

## 2023-10-03 DIAGNOSIS — D649 Anemia, unspecified: Secondary | ICD-10-CM | POA: Diagnosis not present

## 2023-10-03 DIAGNOSIS — E039 Hypothyroidism, unspecified: Secondary | ICD-10-CM | POA: Diagnosis not present

## 2023-10-10 DIAGNOSIS — R079 Chest pain, unspecified: Secondary | ICD-10-CM | POA: Diagnosis not present

## 2023-10-10 DIAGNOSIS — R112 Nausea with vomiting, unspecified: Secondary | ICD-10-CM | POA: Diagnosis not present

## 2023-10-10 DIAGNOSIS — E860 Dehydration: Secondary | ICD-10-CM | POA: Diagnosis not present

## 2023-10-10 DIAGNOSIS — E1165 Type 2 diabetes mellitus with hyperglycemia: Secondary | ICD-10-CM | POA: Diagnosis not present

## 2023-10-10 DIAGNOSIS — A0811 Acute gastroenteropathy due to Norwalk agent: Secondary | ICD-10-CM | POA: Diagnosis not present

## 2023-10-11 DIAGNOSIS — A0811 Acute gastroenteropathy due to Norwalk agent: Secondary | ICD-10-CM | POA: Diagnosis not present

## 2023-10-11 DIAGNOSIS — E1165 Type 2 diabetes mellitus with hyperglycemia: Secondary | ICD-10-CM | POA: Diagnosis not present

## 2023-10-11 DIAGNOSIS — E860 Dehydration: Secondary | ICD-10-CM | POA: Diagnosis not present

## 2023-10-16 DIAGNOSIS — E1165 Type 2 diabetes mellitus with hyperglycemia: Secondary | ICD-10-CM | POA: Diagnosis not present

## 2023-10-16 DIAGNOSIS — Z794 Long term (current) use of insulin: Secondary | ICD-10-CM | POA: Diagnosis not present

## 2023-10-16 DIAGNOSIS — R509 Fever, unspecified: Secondary | ICD-10-CM | POA: Diagnosis not present

## 2023-10-16 DIAGNOSIS — J111 Influenza due to unidentified influenza virus with other respiratory manifestations: Secondary | ICD-10-CM | POA: Diagnosis not present

## 2023-11-27 DIAGNOSIS — Z794 Long term (current) use of insulin: Secondary | ICD-10-CM | POA: Diagnosis not present

## 2023-11-27 DIAGNOSIS — Z7984 Long term (current) use of oral hypoglycemic drugs: Secondary | ICD-10-CM | POA: Diagnosis not present

## 2023-11-27 DIAGNOSIS — Z7985 Long-term (current) use of injectable non-insulin antidiabetic drugs: Secondary | ICD-10-CM | POA: Diagnosis not present

## 2023-11-27 DIAGNOSIS — E1165 Type 2 diabetes mellitus with hyperglycemia: Secondary | ICD-10-CM | POA: Diagnosis not present

## 2023-12-19 DIAGNOSIS — E1165 Type 2 diabetes mellitus with hyperglycemia: Secondary | ICD-10-CM | POA: Diagnosis not present

## 2023-12-25 DIAGNOSIS — E1165 Type 2 diabetes mellitus with hyperglycemia: Secondary | ICD-10-CM | POA: Diagnosis not present

## 2023-12-25 DIAGNOSIS — E785 Hyperlipidemia, unspecified: Secondary | ICD-10-CM | POA: Diagnosis not present

## 2023-12-25 DIAGNOSIS — I10 Essential (primary) hypertension: Secondary | ICD-10-CM | POA: Diagnosis not present

## 2023-12-25 DIAGNOSIS — Z794 Long term (current) use of insulin: Secondary | ICD-10-CM | POA: Diagnosis not present

## 2024-01-06 DIAGNOSIS — E1165 Type 2 diabetes mellitus with hyperglycemia: Secondary | ICD-10-CM | POA: Diagnosis not present

## 2024-01-14 ENCOUNTER — Other Ambulatory Visit: Payer: Self-pay | Admitting: Cardiology

## 2024-02-26 DIAGNOSIS — E1165 Type 2 diabetes mellitus with hyperglycemia: Secondary | ICD-10-CM | POA: Diagnosis not present

## 2024-04-07 DIAGNOSIS — S86112A Strain of other muscle(s) and tendon(s) of posterior muscle group at lower leg level, left leg, initial encounter: Secondary | ICD-10-CM | POA: Diagnosis not present

## 2024-04-07 DIAGNOSIS — M79605 Pain in left leg: Secondary | ICD-10-CM | POA: Diagnosis not present

## 2024-04-07 DIAGNOSIS — R9431 Abnormal electrocardiogram [ECG] [EKG]: Secondary | ICD-10-CM | POA: Diagnosis not present

## 2024-04-07 DIAGNOSIS — I4891 Unspecified atrial fibrillation: Secondary | ICD-10-CM | POA: Diagnosis not present

## 2024-04-07 DIAGNOSIS — S86912A Strain of unspecified muscle(s) and tendon(s) at lower leg level, left leg, initial encounter: Secondary | ICD-10-CM | POA: Diagnosis not present

## 2024-04-07 DIAGNOSIS — M7989 Other specified soft tissue disorders: Secondary | ICD-10-CM | POA: Diagnosis not present

## 2024-04-07 DIAGNOSIS — X58XXXA Exposure to other specified factors, initial encounter: Secondary | ICD-10-CM | POA: Diagnosis not present

## 2024-04-07 DIAGNOSIS — X500XXA Overexertion from strenuous movement or load, initial encounter: Secondary | ICD-10-CM | POA: Diagnosis not present

## 2024-06-11 DIAGNOSIS — Z794 Long term (current) use of insulin: Secondary | ICD-10-CM | POA: Diagnosis not present

## 2024-06-11 DIAGNOSIS — I10 Essential (primary) hypertension: Secondary | ICD-10-CM | POA: Diagnosis not present

## 2024-06-11 DIAGNOSIS — E785 Hyperlipidemia, unspecified: Secondary | ICD-10-CM | POA: Diagnosis not present

## 2024-06-11 DIAGNOSIS — E1165 Type 2 diabetes mellitus with hyperglycemia: Secondary | ICD-10-CM | POA: Diagnosis not present

## 2024-06-28 DIAGNOSIS — E1165 Type 2 diabetes mellitus with hyperglycemia: Secondary | ICD-10-CM | POA: Diagnosis not present

## 2024-07-15 ENCOUNTER — Other Ambulatory Visit: Payer: Self-pay | Admitting: Cardiology
